# Patient Record
Sex: Male | Born: 1985 | Race: White | Hispanic: No | Marital: Married | Smoking: Never smoker
Health system: Southern US, Community
[De-identification: ages and names within clinical notes are randomized; demographics above are authoritative.]

## PROBLEM LIST (undated history)

## (undated) DIAGNOSIS — I10 Essential (primary) hypertension: Secondary | ICD-10-CM

## (undated) HISTORY — DX: Essential (primary) hypertension: I10

---

## 2016-04-20 DIAGNOSIS — Z800 Family history of malignant neoplasm of digestive organs: Secondary | ICD-10-CM | POA: Insufficient documentation

## 2016-10-16 ENCOUNTER — Ambulatory Visit: Payer: Self-pay | Admitting: Physician Assistant

## 2016-10-16 ENCOUNTER — Encounter: Payer: Self-pay | Admitting: Physician Assistant

## 2016-10-16 VITALS — BP 160/102 | HR 78 | Temp 97.9°F

## 2016-10-16 DIAGNOSIS — I10 Essential (primary) hypertension: Secondary | ICD-10-CM

## 2016-10-16 DIAGNOSIS — J00 Acute nasopharyngitis [common cold]: Secondary | ICD-10-CM

## 2016-10-16 MED ORDER — FLUTICASONE PROPIONATE 50 MCG/ACT NA SUSP: 2.0000 | Freq: Every day | NASAL | 6 refills | Status: DC

## 2016-10-16 MED ORDER — LOSARTAN POTASSIUM-HCTZ 100-25 MG PO TABS: 1.0000 | ORAL_TABLET | Freq: Every day | ORAL | 3 refills | Status: DC

## 2016-10-16 NOTE — Progress Notes (Signed)
S: here for bp med refill, has been out of medication for 3 months, thinks he was on 100mg  of losartan, not sure how many mg of hctz, states he forgets to take the medication and has not been compliant in past, has new pcp in [place], will see them in January, also has runny, nose and congestion, mucus is not colored, no fever/chills, cp/sob  O: vitals w elevated bp at 148/100  And 160/102; tms clear, nasal mucosa swollen, throat wnl, neck supple no lymph, lungs c t a, cv rrr  A: htn, allergic rhinitis  P: losartan/hctz 100/25; flonase, discussed htn and if not controlled what it will damage, suggest he see opth to check eyes, see pcp in January

## 2017-04-14 ENCOUNTER — Other Ambulatory Visit: Payer: Self-pay

## 2017-04-27 ENCOUNTER — Ambulatory Visit: Payer: Self-pay | Admitting: Physician Assistant

## 2017-04-27 ENCOUNTER — Encounter (INDEPENDENT_AMBULATORY_CARE_PROVIDER_SITE_OTHER): Payer: Self-pay

## 2017-04-27 ENCOUNTER — Encounter: Payer: Self-pay | Admitting: Physician Assistant

## 2017-04-27 VITALS — BP 141/90 | HR 90 | Temp 98.5°F | Resp 16 | Ht 71.0 in | Wt 260.0 lb

## 2017-04-27 DIAGNOSIS — Z0000 Encounter for general adult medical examination without abnormal findings: Secondary | ICD-10-CM

## 2017-04-27 DIAGNOSIS — I10 Essential (primary) hypertension: Secondary | ICD-10-CM | POA: Insufficient documentation

## 2017-04-27 MED ORDER — LOSARTAN POTASSIUM-HCTZ 100-25 MG PO TABS: 1.0000 | ORAL_TABLET | Freq: Every day | ORAL | 12 refills | Status: DC

## 2017-04-27 NOTE — Progress Notes (Signed)
S: pt here for wellness physical had biometrics for insurance purposes done at work, states he is often thirsty and feels dehydrated a lot, has a cough that has lasted several months but its mostly in the mornings, mucus is clear to white without blood, had a colonoscopy screening last year which was normal, will repeat in 5 years due to his mother's hx of colon ca at 34; also worried about sleep apnea, states his wife says he stops breathing and snores really bad, no other complaints ros neg. PMH: htn   Social:  Nonsmoker, no etoh or drugs Fam:as noted on chart  O: vitals wnl, nad, ENT wnl, neck supple no lymph, lungs c t a, cv rrr, abd soft nontender bs normal all 4 quads  A: wellness, biometric physical, ?sleep apnea  P: labs, weight loss, home sleep study, med refill on losartan-hctz 100-25

## 2017-04-28 LAB — CMP12+LP+TP+TSH+6AC+CBC/D/PLT
A/G RATIO: 2.1 (ref 1.2–2.2)
ALBUMIN: 4.8 g/dL (ref 3.5–5.5)
ALT: 141 IU/L — ABNORMAL HIGH (ref 0–44)
AST: 91 IU/L — AB (ref 0–40)
Alkaline Phosphatase: 86 IU/L (ref 39–117)
BASOS ABS: 0 10*3/uL (ref 0.0–0.2)
BILIRUBIN TOTAL: 0.5 mg/dL (ref 0.0–1.2)
BUN/Creatinine Ratio: 9 (ref 9–20)
BUN: 9 mg/dL (ref 6–20)
Basos: 0 %
CREATININE: 1 mg/dL (ref 0.76–1.27)
Calcium: 9.5 mg/dL (ref 8.7–10.2)
Chloride: 102 mmol/L (ref 96–106)
Chol/HDL Ratio: 5.3 ratio — ABNORMAL HIGH (ref 0.0–5.0)
Cholesterol, Total: 238 mg/dL — ABNORMAL HIGH (ref 100–199)
EOS (ABSOLUTE): 0.2 10*3/uL (ref 0.0–0.4)
ESTIMATED CHD RISK: 1.1 times avg. — AB (ref 0.0–1.0)
Eos: 2 %
FREE THYROXINE INDEX: 2.5 (ref 1.2–4.9)
GFR calc Af Amer: 118 mL/min/{1.73_m2} (ref >59)
GFR, EST NON AFRICAN AMERICAN: 102 mL/min/{1.73_m2} (ref >59)
GGT: 69 IU/L — AB (ref 0–65)
Globulin, Total: 2.3 g/dL (ref 1.5–4.5)
Glucose: 71 mg/dL (ref 65–99)
HDL: 45 mg/dL (ref >39)
HEMOGLOBIN: 15.4 g/dL (ref 13.0–17.7)
Hematocrit: 44.5 % (ref 37.5–51.0)
IMMATURE GRANULOCYTES: 0 %
IRON: 71 ug/dL (ref 38–169)
Immature Grans (Abs): 0 10*3/uL (ref 0.0–0.1)
LDH: 324 IU/L — ABNORMAL HIGH (ref 121–224)
LDL Calculated: 161 mg/dL — ABNORMAL HIGH (ref 0–99)
LYMPHS ABS: 3.1 10*3/uL (ref 0.7–3.1)
Lymphs: 37 %
MCH: 29.8 pg (ref 26.6–33.0)
MCHC: 34.6 g/dL (ref 31.5–35.7)
MCV: 86 fL (ref 79–97)
MONOS ABS: 0.6 10*3/uL (ref 0.1–0.9)
Monocytes: 8 %
NEUTROS PCT: 53 %
Neutrophils Absolute: 4.6 10*3/uL (ref 1.4–7.0)
PHOSPHORUS: 3.3 mg/dL (ref 2.5–4.5)
PLATELETS: 288 10*3/uL (ref 150–379)
Potassium: 3.8 mmol/L (ref 3.5–5.2)
RBC: 5.16 x10E6/uL (ref 4.14–5.80)
RDW: 14.4 % (ref 12.3–15.4)
Sodium: 142 mmol/L (ref 134–144)
T3 UPTAKE RATIO: 24 % (ref 24–39)
T4 TOTAL: 10.5 ug/dL (ref 4.5–12.0)
TOTAL PROTEIN: 7.1 g/dL (ref 6.0–8.5)
TSH: 1.5 u[IU]/mL (ref 0.450–4.500)
Triglycerides: 160 mg/dL — ABNORMAL HIGH (ref 0–149)
URIC ACID: 9.4 mg/dL — AB (ref 3.7–8.6)
VLDL CHOLESTEROL CAL: 32 mg/dL (ref 5–40)
WBC: 8.6 10*3/uL (ref 3.4–10.8)

## 2017-04-28 LAB — HCV COMMENT:

## 2017-04-28 LAB — HEMOGLOBIN A1C
ESTIMATED AVERAGE GLUCOSE: 97 mg/dL
HEMOGLOBIN A1C: 5 % (ref 4.8–5.6)

## 2017-04-28 LAB — VITAMIN D 25 HYDROXY (VIT D DEFICIENCY, FRACTURES): VIT D 25 HYDROXY: 14.3 ng/mL — AB (ref 30.0–100.0)

## 2017-04-28 LAB — HEPATITIS C ANTIBODY (REFLEX)

## 2017-11-03 ENCOUNTER — Encounter: Payer: Self-pay | Admitting: Physician Assistant

## 2017-11-03 ENCOUNTER — Ambulatory Visit: Payer: Self-pay | Admitting: Physician Assistant

## 2017-11-03 VITALS — BP 120/80 | HR 96 | Temp 98.5°F | Resp 16

## 2017-11-03 DIAGNOSIS — I10 Essential (primary) hypertension: Secondary | ICD-10-CM

## 2017-11-03 MED ORDER — TELMISARTAN-HCTZ 40-12.5 MG PO TABS: 1.0000 | ORAL_TABLET | Freq: Every day | ORAL | 0 refills | Status: DC

## 2017-11-03 NOTE — Addendum Note (Signed)
Addended by: Joni ReiningSMITH, RONALD K on: 11/03/2017 08:50 AM   Modules accepted: Orders

## 2017-11-03 NOTE — Progress Notes (Signed)
   Subjective: Medication request     Patient ID: Sean Wolf, male    DOB: 1986-10-17, 31 y.o.   MRN: [id number]  HPI Patient's request medication change from Hyzaar for hypertension secondary to change of insurance plan. Patient state he has enough Hyzaar to last for 1 month. Review of Systems Hypertension    Objective:   Physical Exam  No acute distress. Vital signs stable. HEENT is unremarkable. Neck is supple full and stand. Lungs clear to auscultation heart regular rate and rhythm.       Assessment & Plan: Hypertension   Patient will get baseline labs. Change from Hyzaar to Micardis/HCT 40/12.5. Will follow bimonthly and titrated as necessary.

## 2017-11-10 ENCOUNTER — Other Ambulatory Visit: Payer: Self-pay | Admitting: Physician Assistant

## 2017-11-10 VITALS — BP 125/80 | HR 82 | Temp 98.5°F | Resp 16 | Ht 71.0 in | Wt 276.0 lb

## 2017-11-10 DIAGNOSIS — Z299 Encounter for prophylactic measures, unspecified: Secondary | ICD-10-CM

## 2017-11-10 MED ORDER — LOSARTAN POTASSIUM-HCTZ 50-12.5 MG PO TABS: 1.0000 | ORAL_TABLET | Freq: Two times a day (BID) | ORAL | 3 refills | Status: DC

## 2017-11-10 NOTE — Progress Notes (Signed)
   Subjective: Biometric exam     Patient ID: Sean Wolf, male    DOB: May 03, 1986, 31 y.o.   MRN: [id number]  HPI Patient presents for biometric exam and requests refill of medication for high blood pressure.   Review of Systems Hypertension    Objective:   Physical Exam HEENT is unremarkable. Neck is supple without adenopathy. Lungs are clear to auscultation and heart is regular rate and rhythm. Normal active bowel sounds was negative HSM. Abdomen soft and nontender palpation. Examination of the cervical lumbar spine reveals no deformity. Patient has full  range of motion of the cervicolumbar spine. Examination upper and lower extremity shows no deformity. Patient has full range of motion upper and lower extremities. Strength against resistance 5 over 5. Patient cranial nerves II through XII grossly intact.       Assessment & Plan: Physical exam and medication refill.   Patient medication for low salt/ACT see was refill.

## 2017-11-10 NOTE — Addendum Note (Signed)
Addended by: Catha BrowEACON, MONIQUE T on: 11/10/2017 10:32 AM   Modules accepted: Orders

## 2017-11-11 LAB — CMP12+LP+TP+TSH+6AC+CBC/D/PLT
A/G RATIO: 2.1 (ref 1.2–2.2)
ALK PHOS: 89 IU/L (ref 39–117)
ALT: 87 IU/L — ABNORMAL HIGH (ref 0–44)
AST: 52 IU/L — AB (ref 0–40)
Albumin: 4.5 g/dL (ref 3.5–5.5)
BASOS: 1 %
BILIRUBIN TOTAL: 0.3 mg/dL (ref 0.0–1.2)
BUN / CREAT RATIO: 10 (ref 9–20)
BUN: 10 mg/dL (ref 6–20)
Basophils Absolute: 0 10*3/uL (ref 0.0–0.2)
CHLORIDE: 106 mmol/L (ref 96–106)
CREATININE: 0.96 mg/dL (ref 0.76–1.27)
Calcium: 9.2 mg/dL (ref 8.7–10.2)
Chol/HDL Ratio: 4.9 ratio (ref 0.0–5.0)
Cholesterol, Total: 211 mg/dL — ABNORMAL HIGH (ref 100–199)
EOS (ABSOLUTE): 0.2 10*3/uL (ref 0.0–0.4)
EOS: 4 %
ESTIMATED CHD RISK: 1 times avg. (ref 0.0–1.0)
FREE THYROXINE INDEX: 2 (ref 1.2–4.9)
GFR calc non Af Amer: 106 mL/min/{1.73_m2} (ref >59)
GFR, EST AFRICAN AMERICAN: 123 mL/min/{1.73_m2} (ref >59)
GGT: 49 IU/L (ref 0–65)
GLUCOSE: 88 mg/dL (ref 65–99)
Globulin, Total: 2.1 g/dL (ref 1.5–4.5)
HDL: 43 mg/dL (ref >39)
HEMATOCRIT: 40.6 % (ref 37.5–51.0)
HEMOGLOBIN: 14.2 g/dL (ref 13.0–17.7)
Immature Grans (Abs): 0 10*3/uL (ref 0.0–0.1)
Immature Granulocytes: 1 %
Iron: 80 ug/dL (ref 38–169)
LDH: 263 IU/L — ABNORMAL HIGH (ref 121–224)
LDL CALC: 147 mg/dL — AB (ref 0–99)
LYMPHS ABS: 2.5 10*3/uL (ref 0.7–3.1)
Lymphs: 39 %
MCH: 29.7 pg (ref 26.6–33.0)
MCHC: 35 g/dL (ref 31.5–35.7)
MCV: 85 fL (ref 79–97)
MONOCYTES: 10 %
Monocytes Absolute: 0.6 10*3/uL (ref 0.1–0.9)
NEUTROS PCT: 45 %
Neutrophils Absolute: 3 10*3/uL (ref 1.4–7.0)
PLATELETS: 270 10*3/uL (ref 150–379)
POTASSIUM: 4.2 mmol/L (ref 3.5–5.2)
Phosphorus: 3.1 mg/dL (ref 2.5–4.5)
RBC: 4.78 x10E6/uL (ref 4.14–5.80)
RDW: 14 % (ref 12.3–15.4)
Sodium: 144 mmol/L (ref 134–144)
T3 Uptake Ratio: 23 % — ABNORMAL LOW (ref 24–39)
T4, Total: 8.8 ug/dL (ref 4.5–12.0)
TSH: 1.1 u[IU]/mL (ref 0.450–4.500)
Total Protein: 6.6 g/dL (ref 6.0–8.5)
Triglycerides: 107 mg/dL (ref 0–149)
URIC ACID: 8.3 mg/dL (ref 3.7–8.6)
VLDL CHOLESTEROL CAL: 21 mg/dL (ref 5–40)
WBC: 6.4 10*3/uL (ref 3.4–10.8)

## 2017-11-11 LAB — HCV COMMENT:

## 2017-11-11 LAB — HEPATITIS C ANTIBODY (REFLEX)

## 2017-11-11 LAB — HIV ANTIBODY (ROUTINE TESTING W REFLEX): HIV Screen 4th Generation wRfx: NONREACTIVE

## 2017-11-11 LAB — VITAMIN D 25 HYDROXY (VIT D DEFICIENCY, FRACTURES): Vit D, 25-Hydroxy: 14.6 ng/mL — ABNORMAL LOW (ref 30.0–100.0)

## 2018-07-04 ENCOUNTER — Encounter: Payer: Self-pay | Admitting: Neurology

## 2018-07-04 ENCOUNTER — Ambulatory Visit (INDEPENDENT_AMBULATORY_CARE_PROVIDER_SITE_OTHER): Payer: BC Managed Care – PPO | Admitting: Neurology

## 2018-07-04 VITALS — BP 152/103 | HR 86 | Ht 71.0 in | Wt 271.5 lb

## 2018-07-04 DIAGNOSIS — R400 Somnolence: Secondary | ICD-10-CM | POA: Diagnosis not present

## 2018-07-04 DIAGNOSIS — R030 Elevated blood-pressure reading, without diagnosis of hypertension: Secondary | ICD-10-CM | POA: Diagnosis not present

## 2018-07-04 DIAGNOSIS — R0683 Snoring: Secondary | ICD-10-CM | POA: Diagnosis not present

## 2018-07-04 DIAGNOSIS — R51 Headache: Secondary | ICD-10-CM

## 2018-07-04 DIAGNOSIS — R0681 Apnea, not elsewhere classified: Secondary | ICD-10-CM

## 2018-07-04 DIAGNOSIS — Z820 Family history of epilepsy and other diseases of the nervous system: Secondary | ICD-10-CM

## 2018-07-04 DIAGNOSIS — R519 Headache, unspecified: Secondary | ICD-10-CM

## 2018-07-04 DIAGNOSIS — Z789 Other specified health status: Secondary | ICD-10-CM

## 2018-07-04 DIAGNOSIS — E669 Obesity, unspecified: Secondary | ICD-10-CM

## 2018-07-04 NOTE — Progress Notes (Signed)
Subjective:    Patient ID: Sean FreezeWilliam B Wolf is a 32 y.o. male.  HPI     Sean FoleySaima Daron Stutz, MD, PhD [facility] [address], [address] [address] [place], [place] [address]  Dear Dr. Mcneil SoberGivans,   I saw your patient, Sean Wolf, upon your kind request, in my neurologic clinic today for initial consultation of his sleep disorder, in particular, concern for underlying obstructive sleep apnea. The patient is accompanied by his daughter today. As you know, Sean Wolf is a 32 year old right-handed gentleman with an underlying medical history of hypertension and obesity, who reports snoring and excessive daytime somnolence. I reviewed your office records, which you kindly included. His wife has reported that he has pauses in his breathing and snores loudly. He has also woken up with a sense of gasping for air. He has frequent morning headaches and uses BC powder for his headaches. He denies night to night nocturia except for when he takes his blood pressure medicine late at night. He is supposed to take 2 pills together daily but sometimes does not take it at all. He has gained weight. Bedtime is around 8 PM and rise time around 6 AM. [place] for his [place] related commute and gets sleepy at the wheel, admits to having dosed off at the wheel.  His Epworth sleepiness score is 19 out of 24 today, fatigue score is 57 out of 63. He is married and lives with his wife and 2 children. He works for [employer]. He is a nonsmoker and does not utilize alcohol on a regular basis, does drink caffeine in the form of regular soda, about 4-5 cans per day on average. His mother was diagnosed with obstructive sleep apnea and had a CPAP machine, then had weight loss surgery. He reports that his father had alcoholism.  He wakes up typically with his mouth severely dry, he suspects that he is a mouth breather. He also complains of postnasal drip, typically year long.  His Past Medical  History Is Significant For: Past Medical History:  Diagnosis Date  . Hypertension     His Past Surgical History Is Significant For: No past surgical history on file.  His Family History Is Significant For: Family History  Problem Relation Age of Onset  . Cancer Mother [id number]       colon  . Alcohol abuse Father   . Diabetes Father   . Drug abuse Brother   . Stroke Brother        from vegetation due to iv drug use  . Cancer Maternal Grandmother        lymphoma  . Cancer Paternal Grandmother        breast  . Stroke Paternal Grandmother   . Early death Neg Hx   . Hyperlipidemia Neg Hx   . Heart disease Neg Hx     His Social History Is Significant For: Social History   Socioeconomic History  . Marital status: Married    Spouse name: Not on file  . Number of children: Not on file  . Years of education: Not on file  . Highest education level: Not on file  Occupational History    Comment: [employer]  Social Needs  . Financial resource strain: Not on file  . Food insecurity:    Worry: Not on file    Inability: Not on file  . Transportation needs:    Medical: Not on file  Non-medical: Not on file  Tobacco Use  . Smoking status: Never Smoker  . Smokeless tobacco: Never Used  Substance and Sexual Activity  . Alcohol use: No  . Drug use: No  . Sexual activity: Not on file  Lifestyle  . Physical activity:    Days per week: Not on file    Minutes per session: Not on file  . Stress: Not on file  Relationships  . Social connections:    Talks on phone: Not on file    Gets together: Not on file    Attends religious service: Not on file    Active member of club or organization: Not on file    Attends meetings of clubs or organizations: Not on file    Relationship status: Not on file  Other Topics Concern  . Not on file  Social History Narrative  . Not on file    His Allergies Are:  No Known Allergies:   His Current Medications Are:  Outpatient Encounter  Medications as of 07/04/2018  Medication Sig  . losartan-hydrochlorothiazide (HYZAAR) 50-12.5 MG tablet Take 1 tablet by mouth 2 (two) times daily.   No facility-administered encounter medications on file as of 07/04/2018.   :  Review of Systems:  Out of a complete 14 point review of systems, all are reviewed and negative with the exception of these symptoms as listed below: Review of Systems  Neurological:       Patient endorses insomnia, sleepiness, ans snoring.   Epworth Sleepiness Scale 0= would never doze 1= slight chance of dozing 2= moderate chance of dozing 3= high chance of dozing  Sitting and reading:3 Watching TV:3 Sitting inactive in a public place (ex. Theater or meeting):1 As a passenger in a car for an hour without a break:3 Lying down to rest in the afternoon:3 Sitting and talking to someone:1 Sitting quietly after lunch (no alcohol):2 In a car, while stopped in traffic:3 Total:19    Objective:  Neurological Exam  Physical Exam Physical Examination:   Vitals:   07/04/18 1503  BP: (!) 152/103  Pulse: 86    General Examination: The patient is a very pleasant 32 y.o. male in no acute distress. He appears well-developed and well-nourished and well groomed.   HEENT: Normocephalic, atraumatic, pupils are equal, round and reactive to light and accommodation. Extraocular tracking is good without limitation to gaze excursion or nystagmus noted. Normal smooth pursuit is noted. Hearing is grossly intact. Face is symmetric with normal facial animation and normal facial sensation. Speech is clear with no dysarthria noted. There is no hypophonia. There is no lip, neck/head, jaw or voice tremor. Neck is supple with full range of passive and active motion. There are no carotid bruits on auscultation. Oropharynx exam reveals: mild mouth dryness, adequate dental hygiene and moderate airway crowding, due to tonsils of 2+, and larger uvula. Mallampati is class II. Tongue protrudes  centrally and palate elevates symmetrically. Neck size is 18.25 in. He has an absent overbite. Nasal inspection reveals no significant nasal mucosal bogginess or redness and slight septal deviation to the left.   Chest: Clear to auscultation without wheezing, rhonchi or crackles noted.  Heart: S1+S2+0, regular and normal without murmurs, rubs or gallops noted.   Abdomen: Soft, non-tender and non-distended with normal bowel sounds appreciated on auscultation.  Extremities: There is no pitting edema in the distal lower extremities bilaterally. Pedal pulses are intact.  Skin: Warm and dry without trophic changes noted.  Musculoskeletal: exam reveals no obvious  joint deformities, tenderness or joint swelling or erythema.   Neurologically:  Mental status: The patient is awake, alert and oriented in all 4 spheres. His immediate and remote memory, attention, language skills and fund of knowledge are appropriate. There is no evidence of aphasia, agnosia, apraxia or anomia. Speech is clear with normal prosody and enunciation. Thought process is linear. Mood is normal and affect is normal.  Cranial nerves II - XII are as described above under HEENT exam. In addition: shoulder shrug is normal with equal shoulder height noted. Motor exam: Normal bulk, strength and tone is noted. There is no drift, tremor or rebound. Romberg is negative. Reflexes are 2+ throughout. Fine motor skills and coordination: intact with normal finger taps, normal hand movements, normal rapid alternating patting, normal foot taps and normal foot agility.  Cerebellar testing: No dysmetria or intention tremor on finger to nose testing. Heel to shin is unremarkable bilaterally. There is no truncal or gait ataxia.  Sensory exam: intact to light touch in the upper and lower extremities.  Gait, station and balance: He stands easily. No veering to one side is noted. No leaning to one side is noted. Posture is age-appropriate and stance is  narrow based. Gait shows normal stride length and normal pace. No problems turning are noted. Tandem walk is unremarkable.   Assessment and Plan:  In summary, Sean Wolf is a very pleasant 32 y.o.-year old male with an underlying medical history of hypertension and obesity, whose history and physical exam are concerning for obstructive sleep apnea (OSA). I had a long chat with the patient about my findings and the diagnosis of OSA, its prognosis and treatment options. We talked about medical treatments, surgical interventions and non-pharmacological approaches. I explained in particular the risks and ramifications of untreated moderate to severe OSA, especially with respect to developing cardiovascular disease down the Road, including congestive heart failure, difficult to treat hypertension, cardiac arrhythmias, or stroke. Even type 2 diabetes has, in part, been linked to untreated OSA. Symptoms of untreated OSA include daytime sleepiness, memory problems, mood irritability and mood disorder such as depression and anxiety, lack of energy, as well as recurrent headaches, especially morning headaches. We talked about trying to maintain a healthy lifestyle in general, as well as the importance of weight control. I encouraged the patient to eat healthy, exercise daily and keep well hydrated, to keep a scheduled bedtime and wake time routine, to not skip any meals and eat healthy snacks in between meals. I advised the patient strongly not to drive when feeling sleepy. I recommended the following at this time: sleep study with potential positive airway pressure titration. (We will score hypopneas at 3%).   I explained the sleep test procedure to the patient and also outlined possible surgical and non-surgical treatment options of OSA, including the use of a custom-made dental device (which would require a referral to a specialist dentist or oral surgeon), upper airway surgical options, such as pillar implants,  radiofrequency surgery, tongue base surgery, and UPPP (which would involve a referral to an ENT surgeon). Rarely, jaw surgery such as mandibular advancement may be considered.  I also explained the CPAP treatment option to the patient, who indicated that he would be willing to try CPAP if the need arises. I explained the importance of being compliant with PAP treatment, not only for insurance purposes but primarily to improve His symptoms, and for the patient's long term health benefit, including to reduce His cardiovascular risks. I answered all  his questions today and the patient was in agreement. I would like to see him back after the sleep study is completed and encouraged him to call with any interim questions, concerns, problems or updates.   Thank you very much for allowing me to participate in the care of this nice patient. If I can be of any further assistance to you please do not hesitate to call me at [phone].  Sincerely,   Sean Foley, MD, PhD

## 2018-07-04 NOTE — Patient Instructions (Signed)

## 2018-07-19 ENCOUNTER — Ambulatory Visit (INDEPENDENT_AMBULATORY_CARE_PROVIDER_SITE_OTHER): Payer: BC Managed Care – PPO | Admitting: Neurology

## 2018-07-19 DIAGNOSIS — R400 Somnolence: Secondary | ICD-10-CM

## 2018-07-19 DIAGNOSIS — R0681 Apnea, not elsewhere classified: Secondary | ICD-10-CM | POA: Diagnosis not present

## 2018-07-19 DIAGNOSIS — Z789 Other specified health status: Secondary | ICD-10-CM

## 2018-07-19 DIAGNOSIS — R0683 Snoring: Secondary | ICD-10-CM

## 2018-07-19 DIAGNOSIS — R030 Elevated blood-pressure reading, without diagnosis of hypertension: Secondary | ICD-10-CM

## 2018-07-19 DIAGNOSIS — G4720 Circadian rhythm sleep disorder, unspecified type: Secondary | ICD-10-CM

## 2018-07-19 DIAGNOSIS — R51 Headache: Secondary | ICD-10-CM

## 2018-07-19 DIAGNOSIS — G4733 Obstructive sleep apnea (adult) (pediatric): Secondary | ICD-10-CM

## 2018-07-19 DIAGNOSIS — R519 Headache, unspecified: Secondary | ICD-10-CM

## 2018-07-19 DIAGNOSIS — Z820 Family history of epilepsy and other diseases of the nervous system: Secondary | ICD-10-CM

## 2018-07-19 DIAGNOSIS — E669 Obesity, unspecified: Secondary | ICD-10-CM

## 2018-07-21 ENCOUNTER — Telehealth: Payer: Self-pay

## 2018-07-21 NOTE — Telephone Encounter (Signed)
I called pt to discuss his sleep study results. No answer, left a message asking him to call me back. 

## 2018-07-21 NOTE — Telephone Encounter (Signed)
I called pt. I advised pt that Dr. Frances FurbishAthar reviewed their sleep study results and found that pt has a moderate to severe osa with an O2 nadir of 78% and recommends that pt be treated with a cpap. Dr. Frances FurbishAthar recommends that pt return for a repeat sleep study in order to properly titrate the cpap and ensure a good mask fit. Pt is agreeable to returning for a titration study. I advised pt that our sleep lab will file with pt's insurance and call pt to schedule the sleep study when we hear back from the pt's insurance regarding coverage of this sleep study. Pt verbalized understanding of results. Pt had no questions at this time but was encouraged to call back if questions arise.

## 2018-07-21 NOTE — Procedures (Signed)
PATIENT'S NAME:  Sean Wolf, Sean Wolf DOB:      1986/10/31      MR#:    [id number]     DATE OF RECORDING: 07/19/2018 REFERRING M.D.:  Gaspar Cola DMD Study Performed:   Baseline Polysomnogram HISTORY: 32 year old man with a medical history of hypertension and obesity, who reports snoring and excessive daytime somnolence. The patient endorsed the Epworth Sleepiness Scale at 19 points. The patient's weight 271 pounds with a height of 71 (inches), resulting in a BMI of 38. kg/m2. The patient's neck circumference measured 18.25 inches.  CURRENT MEDICATIONS: Hyzaar   PROCEDURE:  This is a multichannel digital polysomnogram utilizing the Somnostar 11.2 system.  Electrodes and sensors were applied and monitored per AASM Specifications.   EEG, EOG, Chin and Limb EMG, were sampled at 200 Hz.  ECG, Snore and Nasal Pressure, Thermal Airflow, Respiratory Effort, CPAP Flow and Pressure, Oximetry was sampled at 50 Hz. Digital video and audio were recorded.      BASELINE STUDY  Lights Out was at 21:08 and Lights On at 04:51.  Total recording time (TRT) was 464 minutes, with a total sleep time (TST) of 397.5 minutes. The patient's sleep latency was 44 minutes, which is delayed. REM latency was 68.5 minutes. The sleep efficiency was 85.7%.     SLEEP ARCHITECTURE: WASO (Wake after sleep onset) was 14 minutes. There were 16.5 minutes in Stage N1, 84.5 minutes Stage N2, 169 minutes Stage N3 and 127.5 minutes in Stage REM. The percentage of Stage N1 was 4.2%, Stage N2 was 21.3%, Stage N3 was 42.5%, which is increased, and Stage R (REM sleep) was 32.1%, which is increased. The arousals were noted as: 23 were spontaneous, 0 were associated with PLMs, 15 were associated with respiratory events.  RESPIRATORY ANALYSIS:  There were a total of 131 respiratory events:  14 obstructive apneas, 0 central apneas and 0 mixed apneas with a total of 14 apneas and an apnea index (AI) of 2.1 /hour. There were 117 hypopneas with a hypopnea  index of 17.7 /hour. The patient also had 0 respiratory event related arousals (RERAs).      The total APNEA/HYPOPNEA INDEX (AHI) was 19.8/hour and the total RESPIRATORY DISTURBANCE INDEX was 0. 19.8 /hour.  114 events occurred in REM sleep and 28 events in NREM. The REM AHI was 53.6 /hour, versus a non-REM AHI of 3.8. The patient spent 304 minutes of total sleep time in the supine position and 94 minutes in non-supine.. The supine AHI was 21.7/hour versus a non-supine AHI of 13.4.  OXYGEN SATURATION & C02:  The Wake baseline 02 saturation was 98%, with the lowest being 78%. Time spent below 89% saturation equaled 25 minutes.   PERIODIC LIMB MOVEMENTS: The patient had a total of 0 Periodic Limb Movements.  The Periodic Limb Movement (PLM) index was 0 and the PLM Arousal index was 0/hour. Audio and video analysis did not show any abnormal or unusual movements, behaviors, phonations or vocalizations. The patient took no bathroom breaks. Moderate to loud snoring was noted. The EKG was in keeping with normal sinus rhythm (NSR).  Post-study, the patient indicated that sleep was the same as usual.   IMPRESSION:  1. Obstructive Sleep Apnea (OSA) 2. Dysfunctions associated with sleep stages or arousal from sleep  RECOMMENDATIONS:  1. This study demonstrates moderate to severe obstructive sleep apnea, with a total AHI of 19.8/hour, REM AHI of 53.6/hour, supine AHI of 21.7/hour and O2 nadir of 78%. Treatment with positive airway pressure in the  form of CPAP is recommended. This will require a full night titration study to optimize therapy. Other treatment options (generally speaking) may include avoidance of supine sleep position along with weight loss, upper airway or jaw surgery in selected patients or the use of an oral appliance in certain patients. ENT evaluation and/or consultation with a maxillofacial surgeon or dentist may be feasible in some instances. Please note that untreated obstructive sleep  apnea may carry additional perioperative morbidity. Patients with significant obstructive sleep apnea should receive perioperative PAP therapy and the surgeons and particularly the anesthesiologist should be informed of the diagnosis and the severity of the sleep disordered breathing. 2. This study shows sleep fragmentation and abnormal sleep stage percentages; these are nonspecific findings and per se do not signify an intrinsic sleep disorder or a cause for the patient's sleep-related symptoms. Causes include (but are not limited to) the first night effect of the sleep study, circadian rhythm disturbances, medication effect or an underlying mood disorder or medical problem.  3. The patient should be cautioned not to drive, work at heights, or operate dangerous or heavy equipment when tired or sleepy. Review and reiteration of good sleep hygiene measures should be pursued with any patient. 4. The patient will be seen in follow-up [facility] for discussion of the test results, symptom and treatment compliance review, further management strategies, etc. The referring provider will be notified of the test results.  I certify that I have reviewed the entire raw data recording prior to the issuance of this report in accordance with the Standards of Accreditation of the American Academy of Sleep Medicine (AASM)   Huston FoleySaima Yvan Dority, MD, PhD Diplomat, American Board of Neurology and Sleep Medicine (Neurology and Sleep Medicine)

## 2018-07-21 NOTE — Addendum Note (Signed)
Addended by: Huston FoleyATHAR, Raliyah Montella on: 07/21/2018 08:18 AM   Modules accepted: Orders

## 2018-07-21 NOTE — Telephone Encounter (Signed)
-----   Message from Huston FoleySaima Athar, MD sent at 07/21/2018  8:18 AM EDT ----- Patient referred by dentist, seen by me on 07/04/18, diagnostic PSG on 07/19/18.   Please call and notify the patient that the recent sleep study showed moderate to severe obstructive sleep apnea, with a total AHI of 19.8/hour, REM AHI of 53.6/hour, supine AHI of 21.7/hour and O2 nadir of 78%. Treatment with positive airway pressure in the form of CPAP is recommended. This will require a repeat sleep study for proper titration and mask fitting and correct monitoring of the oxygen saturations. Please explain to patient. I have placed an order in the chart. Thanks.  Huston FoleySaima Athar, MD, PhD [facility])

## 2018-07-21 NOTE — Progress Notes (Signed)
Patient referred by dentist, seen by me on 07/04/18, diagnostic PSG on 07/19/18.   Please call and notify the patient that the recent sleep study showed moderate to severe obstructive sleep apnea, with a total AHI of 19.8/hour, REM AHI of 53.6/hour, supine AHI of 21.7/hour and O2 nadir of 78%. Treatment with positive airway pressure in the form of CPAP is recommended. This will require a repeat sleep study for proper titration and mask fitting and correct monitoring of the oxygen saturations. Please explain to patient. I have placed an order in the chart. Thanks.  Huston FoleySaima Fleda Pagel, MD, PhD [facility])

## 2018-08-12 ENCOUNTER — Ambulatory Visit (INDEPENDENT_AMBULATORY_CARE_PROVIDER_SITE_OTHER): Payer: BC Managed Care – PPO | Admitting: Neurology

## 2018-08-12 DIAGNOSIS — G4733 Obstructive sleep apnea (adult) (pediatric): Secondary | ICD-10-CM

## 2018-08-12 DIAGNOSIS — G4720 Circadian rhythm sleep disorder, unspecified type: Secondary | ICD-10-CM

## 2018-08-12 DIAGNOSIS — R400 Somnolence: Secondary | ICD-10-CM

## 2018-08-12 DIAGNOSIS — Z820 Family history of epilepsy and other diseases of the nervous system: Secondary | ICD-10-CM

## 2018-08-12 DIAGNOSIS — R030 Elevated blood-pressure reading, without diagnosis of hypertension: Secondary | ICD-10-CM

## 2018-08-12 DIAGNOSIS — R519 Headache, unspecified: Secondary | ICD-10-CM

## 2018-08-12 DIAGNOSIS — R51 Headache: Secondary | ICD-10-CM

## 2018-08-12 DIAGNOSIS — E669 Obesity, unspecified: Secondary | ICD-10-CM

## 2018-08-15 ENCOUNTER — Telehealth: Payer: Self-pay

## 2018-08-15 NOTE — Telephone Encounter (Signed)
-----   Message from Huston FoleySaima Athar, MD sent at 08/15/2018  8:06 AM EDT ----- Patient referred by dentist, seen by me on 07/04/18, diagnostic PSG on 07/19/18. Patient had a CPAP titration study on 08/12/18.  Please call and inform patient that I have entered an order for treatment with positive airway pressure (PAP) treatment for obstructive sleep apnea (OSA). He did well during the latest sleep study with CPAP. We will, therefore, arrange for a machine for home use through a DME (durable medical equipment) company of His choice; and I will see the patient back in follow-up in about 10 weeks. Please also explain to the patient that I will be looking out for compliance data, which can be downloaded from the machine (stored on an SD card, that is inserted in the machine) or via remote access through a modem, that is built into the machine. At the time of the followup appointment we will discuss sleep study results and how it is going with PAP treatment at home. Please advise patient to bring His machine at the time of the first FU visit, even though this is cumbersome. Bringing the machine for every visit after that will likely not be needed, but often helps for the first visit to troubleshoot if needed. Please re-enforce the importance of compliance with treatment and the need for us to monitor compliance data - often an insurance requirement and actually good feedback for the patient as far as how they are doing.  Also remind patient, that any interim PAP machine or mask issues should be first addressed with the DME company, as they can often help better with technical and mask fit issues. Please ask if patient has a preference regarding DME company.  Please also make sure, the patient has a follow-up appointment with me in about 10 weeks from the setup date, thanks. May see one of our nurse practitioners if needed for proper timing of the FU appointment.  Please fax or rout report to the referring provider. Thanks,    Huston FoleySaima Athar, MD, PhD [facility])

## 2018-08-15 NOTE — Progress Notes (Signed)
Patient referred by dentist, seen by me on 07/04/18, diagnostic PSG on 07/19/18. Patient had a CPAP titration study on 08/12/18.  Please call and inform patient that I have entered an order for treatment with positive airway pressure (PAP) treatment for obstructive sleep apnea (OSA). He did well during the latest sleep study with CPAP. We will, therefore, arrange for a machine for home use through a DME (durable medical equipment) company of His choice; and I will see the patient back in follow-up in about 10 weeks. Please also explain to the patient that I will be looking out for compliance data, which can be downloaded from the machine (stored on an SD card, that is inserted in the machine) or via remote access through a modem, that is built into the machine. At the time of the followup appointment we will discuss sleep study results and how it is going with PAP treatment at home. Please advise patient to bring His machine at the time of the first FU visit, even though this is cumbersome. Bringing the machine for every visit after that will likely not be needed, but often helps for the first visit to troubleshoot if needed. Please re-enforce the importance of compliance with treatment and the need for us to monitor compliance data - often an insurance requirement and actually good feedback for the patient as far as how they are doing.  Also remind patient, that any interim PAP machine or mask issues should be first addressed with the DME company, as they can often help better with technical and mask fit issues. Please ask if patient has a preference regarding DME company.  Please also make sure, the patient has a follow-up appointment with me in about 10 weeks from the setup date, thanks. May see one of our nurse practitioners if needed for proper timing of the FU appointment.  Please fax or rout report to the referring provider. Thanks,   Huston FoleySaima Akira Perusse, MD, PhD [facility])

## 2018-08-15 NOTE — Telephone Encounter (Signed)
I called pt. I advised pt that Dr. Frances FurbishAthar reviewed their sleep study results and found that pt did well with the cpap during his latest sleep study. Dr. Frances FurbishAthar recommends that pt start a cpap at home. I reviewed PAP compliance expectations with the pt. Pt is agreeable to starting a CPAP. I advised pt that an order will be sent to a DME, [facility], and [facility] will call the pt within about one week after they file with the pt's insurance. [facility] will show the pt how to use the machine, fit for masks, and troubleshoot the CPAP if needed. A follow up appt was made for insurance purposes with Dr. Frances FurbishAthar on 11/09/2018 at 9:30am. Pt verbalized understanding to arrive 15 minutes early and bring their CPAP. A letter with all of this information in it will be mailed to the pt as a reminder and sent to his mychart account, per his request. I verified with the pt that the address we have on file is correct. Pt verbalized understanding of results. Pt had no questions at this time but was encouraged to call back if questions arise.

## 2018-08-15 NOTE — Procedures (Signed)
PATIENT'S NAME:  Sean Wolf, Sean Wolf DOB:      [birth year]      MR#:    [id number]     DATE OF RECORDING: 08/12/2018 REFERRING M.D.:  Gaspar Cola DMD Study Performed:   CPAP  Titration HISTORY: 32 year old man with a medical history of hypertension and obesity, who returns for a PAP titration study to treat his OSA. His baseline PSG on 07/19/18 showed an AHI of 19.8, REM AHI of 53.6, Supine AHI of 21.7, and a nadir of 78%. The patient endorsed the Epworth Sleepiness Scale at 19/24 points, BMI of 38. kg/m2. The patient's neck circumference measured 18 inches.  CURRENT MEDICATIONS: Hyzaar  PROCEDURE:  This is a multichannel digital polysomnogram utilizing the SomnoStar 11.2 system.  Electrodes and sensors were applied and monitored per AASM Specifications.   EEG, EOG, Chin and Limb EMG, were sampled at 200 Hz.  ECG, Snore and Nasal Pressure, Thermal Airflow, Respiratory Effort, CPAP Flow and Pressure, Oximetry was sampled at 50 Hz. Digital video and audio were recorded.      The patient was fitted with a small F20 Airtouch FFM (due to mouth venting). CPAP was initiated at 5 cmH20 with heated humidity per AASM standards and pressure was advanced to 9 cmH20 because of hypopneas, apneas and desaturations. At a PAP pressure of 9 cmH20, there was a reduction of the AHI to 0/hour with supine REM sleep achieved and O2 nadir of 94%.   Lights Out was at 21:01 and Lights On at 05:11. Total recording time (TRT) was 490 minutes, with a total sleep time (TST) of 415 minutes. The patient's sleep latency was 61.5 minutes, which is delayed. REM latency was 65.5 minutes, which is borderline low. The sleep efficiency was 84.7 %.    SLEEP ARCHITECTURE: WASO (Wake after sleep onset) was 11.5 minutes with no significant sleep fragmentation noted. There were 5 minutes in Stage N1, 145.5 minutes Stage N2, 112 minutes Stage N3 and 152.5 minutes in Stage REM. The percentage of Stage N1 was 1.2%, Stage N2 was 35.1%, Stage N3 was 27.%,  which is increased, and Stage R (REM sleep) was 36.7%, which is increased and in keeping with rebound. The arousals were noted as: 9 were spontaneous, 0 were associated with PLMs, 4 were associated with respiratory events.  RESPIRATORY ANALYSIS:  There was a total of 11 respiratory events: 0 obstructive apneas, 2 central apneas and 0 mixed apneas with a total of 2 apneas and an apnea index (AI) of .3 /hour. There were 9 hypopneas with a hypopnea index of 1.3/hour. The patient also had 0 respiratory event related arousals (RERAs).      The total APNEA/HYPOPNEA INDEX  (AHI) was 1.6 /hour and the total RESPIRATORY DISTURBANCE INDEX was 1.6 /hour  5 events occurred in REM sleep and 6 events in NREM. The REM AHI was 2. /hour versus a non-REM AHI of 1.4 /hour.  The patient spent 309.5 minutes of total sleep time in the supine position and 106 minutes in non-supine. The supine AHI was 1.8, versus a non-supine AHI of 1.1.  OXYGEN SATURATION & C02:  The baseline 02 saturation was 95%, with the lowest being 91%. Time spent below 89% saturation equaled 0 minutes.  PERIODIC LIMB MOVEMENTS:  The patient had a total of 0 Periodic Limb Movements. The Periodic Limb Movement (PLM) index was 0 and the PLM Arousal index was 0 /hour.  Audio and video analysis did not show any abnormal or unusual movements, behaviors, phonations or vocalizations.  The patient took no bathroom breaks. The EKG was in keeping with normal sinus rhythm (NSR).  Post-study, the patient indicated that sleep was better than usual.   IMPRESSION:   1. Obstructive Sleep Apnea (OSA)   RECOMMENDATIONS:   1. This study demonstrates resolution of the patient's obstructive sleep apnea with CPAP therapy. I will, therefore, start the patient on home CPAP treatment at a pressure of 9 cm via small FFM, with heated humidity. The patient should be reminded to be fully compliant with PAP therapy to improve sleep related symptoms and decrease long term  cardiovascular risks. The patient should be reminded, that it may take up to 3 months to get fully used to using PAP with all planned sleep. The earlier full compliance is achieved, the better long term compliance tends to be. Please note that untreated obstructive sleep apnea may carry additional perioperative morbidity. Patients with significant obstructive sleep apnea should receive perioperative PAP therapy and the surgeons and particularly the anesthesiologist should be informed of the diagnosis and the severity of the sleep disordered breathing. 2. The patient should be cautioned not to drive, work at heights, or operate dangerous or heavy equipment when tired or sleepy. Review and reiteration of good sleep hygiene measures should be pursued with any patient. 3. The patient will be [facility] for discussion of the test results, symptom and treatment compliance review, further management strategies, etc. The referring provider will be notified of the test results.   I certify that I have reviewed the entire raw data recording prior to the issuance of this report in accordance with the Standards of Accreditation of the American Academy of Sleep Medicine (AASM)     Huston FoleySaima Vash Quezada, MD, PhD Diplomat, American Board of Neurology and Sleep Medicine (Neurology and Sleep Medicine)

## 2018-08-15 NOTE — Addendum Note (Signed)
Addended by: Huston FoleyATHAR, Devaney Segers on: 08/15/2018 08:07 AM   Modules accepted: Orders

## 2018-09-28 ENCOUNTER — Encounter: Payer: Self-pay | Admitting: Neurology

## 2018-11-07 ENCOUNTER — Encounter: Payer: Self-pay | Admitting: Neurology

## 2018-11-09 ENCOUNTER — Encounter: Payer: Self-pay | Admitting: Neurology

## 2018-11-09 ENCOUNTER — Ambulatory Visit: Payer: BC Managed Care – PPO | Admitting: Neurology

## 2018-11-09 VITALS — BP 143/95 | HR 81 | Ht 71.0 in | Wt 282.0 lb

## 2018-11-09 DIAGNOSIS — G4733 Obstructive sleep apnea (adult) (pediatric): Secondary | ICD-10-CM

## 2018-11-09 DIAGNOSIS — G478 Other sleep disorders: Secondary | ICD-10-CM | POA: Diagnosis not present

## 2018-11-09 DIAGNOSIS — R400 Somnolence: Secondary | ICD-10-CM

## 2018-11-09 DIAGNOSIS — Z9989 Dependence on other enabling machines and devices: Secondary | ICD-10-CM | POA: Diagnosis not present

## 2018-11-09 MED ORDER — SOLRIAMFETOL HCL 75 MG PO TABS: 75.0000 mg | ORAL_TABLET | Freq: Every day | ORAL | 0 refills | Status: DC

## 2018-11-09 NOTE — Progress Notes (Signed)
suSubjective:    Patient ID: Sean Wolf is a 32 y.o. male.  HPI     Interim history:   Sean Wolf is a 32 year old right-handed gentleman with an underlying medical history of hypertension and obesity, who presents for follow-up consultation of his obstructive sleep apnea, after recent sleep study testing and starting CPAP therapy. The patient is unaccompanied today. I first met him on 07/04/2018 at the request of his dentist, at which time he reported snoring and excessive daytime somnolence, with an Epworth sleepiness score of 19 at the time. He was advised to proceed with a sleep study. He had a baseline sleep study, followed by a CPAP titration study. I went over his test results with him in detail today. Baseline sleep study from 07/19/2018 showed a sleep efficiency of 85.7%, sleep latency delayed at 44 minutes, REM latency 68.5 minutes. He had an increased percentage of slow-wave sleep and an increased percentage of REM sleep. He had a total AHI of 19.8 per hour, REM AHI was 53.6 per hour, supine AHI was 21.7 per hour, average oxygen saturation was 98%, nadir was 78%. He had no significant PLMS. He was advised to proceed with a CPAP titration study to optimize treatment settings and monitor oxygen level properly. He had a CPAP titration on 08/17/2018. Sleep efficiency was 84.7%, sleep latency 61.5 minutes, REM latency 65.5 minutes. He had an increased percentage of REM sleep again noted. He was fitted with a small full face mask due to mouth venting noted in CPAP was initiated at 5 cm and further titrated to 9 cm as the final pressure. On the setting his AHI was 0 per hour with supine REM sleep achieved an O2 nadir of 94%. He had no significant PLMS during this study and based on his test results I prescribed CPAP therapy for home use at a pressure of 9 cm.  Today, 11/09/2018: I reviewed his CPAP compliance data from 10/09/2018 through 11/07/2018, during which time he used his machine every night  with percent used days greater than 4 hours at 87%, indicating very good compliance with an average usage of 6 hours and 47 minutes, residual AHI at goal at 1.3 per hour, leak on the low side with the 95th percentile at 3.4 L/m on a pressure of 9 cm with EPR of 3. He reports that he has been compliant with CPAP. He has not had a dramatic improvement in his daytime somnolence. His Epworth sleepiness score is 15 out of 24 today, previously was 19. He is indicating that he does wake up a little better and easier in the mornings. He tries to be in bed around 8 and typically falls asleep by 8:30 or 9 and has to get up at 5 for work. He tries to make enough time for sleep. He is motivated to continue with treatment but was hoping to feel more improved at this point.   The patient's allergies, current medications, family history, past medical history, past social history, past surgical history and problem list were reviewed and updated as appropriate.   Previously:   07/04/2018: (He) reports snoring and excessive daytime somnolence. I reviewed your office records, which you kindly included. His wife has reported that he has pauses in his breathing and snores loudly. He has also woken up with a sense of gasping for air. He has frequent morning headaches and uses BC powder for his headaches. He denies night to night nocturia except for when he takes his blood pressure  medicine late at night. He is supposed to take 2 pills together daily but sometimes does not take it at all. He has gained weight. Bedtime is around 8 PM and rise time around 6 AM. He has to drive from [facility] to [place] for his work related commute and gets sleepy at the wheel, admits to having dosed off at the wheel.  His Epworth sleepiness score is 19 out of 24 today, fatigue score is 57 out of 63. He is married and lives with his wife and 2 children. He works for [employer]. He is a nonsmoker and does not utilize alcohol on a regular basis, does drink  caffeine in the form of regular soda, about 4-5 cans per day on average. His mother was diagnosed with obstructive sleep apnea and had a CPAP machine, then had weight loss surgery. He reports that his father had alcoholism.  He wakes up typically with his mouth severely dry, he suspects that he is a mouth breather. He also complains of postnasal drip, typically year long.   His Past Medical History Is Significant For: Past Medical History:  Diagnosis Date  . Hypertension     His Past Surgical History Is Significant For: No past surgical history on file.  His Family History Is Significant For: Family History  Problem Relation Age of Onset  . Cancer Mother 50       colon  . Alcohol abuse Father   . Diabetes Father   . Drug abuse Brother   . Stroke Brother        from vegetation due to iv drug use  . Cancer Maternal Grandmother        lymphoma  . Cancer Paternal Grandmother        breast  . Stroke Paternal Grandmother   . Early death Neg Hx   . Hyperlipidemia Neg Hx   . Heart disease Neg Hx     His Social History Is Significant For: Social History   Socioeconomic History  . Marital status: Married    Spouse name: Not on file  . Number of children: Not on file  . Years of education: Not on file  . Highest education level: Not on file  Occupational History    Comment: [employer]  Social Needs  . Financial resource strain: Not on file  . Food insecurity:    Worry: Not on file    Inability: Not on file  . Transportation needs:    Medical: Not on file    Non-medical: Not on file  Tobacco Use  . Smoking status: Never Smoker  . Smokeless tobacco: Never Used  Substance and Sexual Activity  . Alcohol use: No  . Drug use: No  . Sexual activity: Not on file  Lifestyle  . Physical activity:    Days per week: Not on file    Minutes per session: Not on file  . Stress: Not on file  Relationships  . Social connections:    Talks on phone: Not on file    Gets together: Not  on file    Attends religious service: Not on file    Active member of club or organization: Not on file    Attends meetings of clubs or organizations: Not on file    Relationship status: Not on file  Other Topics Concern  . Not on file  Social History Narrative  . Not on file    His Allergies Are:  No Known Allergies:   His Current  Medications Are:  Outpatient Encounter Medications as of 11/09/2018  Medication Sig  . losartan-hydrochlorothiazide (HYZAAR) 50-12.5 MG tablet Take 1 tablet by mouth 2 (two) times daily.  . Solriamfetol HCl (SUNOSI) 75 MG TABS Take 75 mg by mouth daily. Start by taking 1/2 tablet daily for 2 weeks. Then increase to 1 whole tablet daily for 1 week.   No facility-administered encounter medications on file as of 11/09/2018.   :  Review of Systems:  Out of a complete 14 point review of systems, all are reviewed and negative with the exception of these symptoms as listed below: Review of Systems  Neurological:       Pt presents today to discuss his cpap. Pt says that it is easier to wake up after using auto pap.   Epworth Sleepiness Scale 0= would never doze 1= slight chance of dozing 2= moderate chance of dozing 3= high chance of dozing  Sitting and reading: 1 Watching TV: 3 Sitting inactive in a public place (ex. Theater or meeting): 1 As a passenger in a car for an hour without a break: 3 Lying down to rest in the afternoon: 3 Sitting and talking to someone: 1 Sitting quietly after lunch (no alcohol): 2 In a car, while stopped in traffic: 1 Total: 15     Objective:  Neurological Exam  Physical Exam Physical Examination:   Vitals:   11/09/18 0938  BP: (!) 143/95  Pulse: 81    General Examination: The patient is a very pleasant 32 y.o. male in no acute distress. He appears well-developed and well-nourished and well groomed.   HEENT: Normocephalic, atraumatic, pupils are equal, round and reactive to light and accommodation.  Extraocular tracking is good without limitation to gaze excursion or nystagmus noted. Normal smooth pursuit is noted. Hearing is grossly intact. Face is symmetric with normal facial animation and normal facial sensation. Speech is clear with no dysarthria noted. There is no hypophonia. There is no lip, neck/head, jaw or voice tremor. Neck shows FROM. Oropharynx exam reveals: mild mouth dryness, adequate dental hygiene and moderate airway crowding. Tongue protrudes centrally and palate elevates symmetrically.   Chest: Clear to auscultation without wheezing, rhonchi or crackles noted.  Heart: S1+S2+0, regular and normal without murmurs, rubs or gallops noted.   Abdomen: Soft, non-tender and non-distended with normal bowel sounds appreciated on auscultation.  Extremities: There is no pitting edema in the distal lower extremities bilaterally. Pedal pulses are intact.  Skin: Warm and dry without trophic changes noted.  Musculoskeletal: exam reveals no obvious joint deformities, tenderness or joint swelling or erythema.   Neurologically:  Mental status: The patient is awake, alert and oriented in all 4 spheres. His immediate and remote memory, attention, language skills and fund of knowledge are appropriate. There is no evidence of aphasia, agnosia, apraxia or anomia. Speech is clear with normal prosody and enunciation. Thought process is linear. Mood is normal and affect is normal.  Cranial nerves II - XII are as described above under HEENT exam. In addition: shoulder shrug is normal with equal shoulder height noted. Motor exam: Normal bulk, strength and tone is noted. There is no tremor. Romberg is negative. Fine motor skills and coordination: grossly intact.  Cerebellar testing: No dysmetria or intention tremor on finger to nose testing. Heel to shin is unremarkable bilaterally. There is no truncal or gait ataxia.  Sensory exam: intact to light touch in the upper and lower extremities.  Gait,  station and balance: He stands easily. No veering  to one side is noted. No leaning to one side is noted. Posture is age-appropriate and stance is narrow based. Gait shows normal stride length and normal pace. No problems turning are noted. Tandem walk is unremarkable.   Assessment and Plan:  In summary, JUSTUS DROKE is a very pleasant 32 year old male with an underlying medical history of hypertension and obesity, who presents for follow-up consultation of his obstructive sleep apnea and significant hypersomnolence after recent sleep study testing. His baseline sleep study in August 2019 showed moderate to severe obstructive sleep apnea and he had a CPAP titration study in September 2019 with very good resolution of his sleep disordered breathing with CPAP of 9 cm. He has been compliant with his CPAP, we talked about his study results in detail as well as his compliance data. He is highly commended for his treatment adherence. While he does not indicate telltale improvement in his daytime somnolence, he does admit that he is easier to wake up in the mornings, nevertheless he requires 8 hours of sleep to be fully rested to be able to wake up easier in the mornings but unfortunately still indicates significant daytime somnolence with an Epworth sleepiness score is still higher at 15 out of 24. Interestingly he did have quite a bit of REM sleep during both the sleep studies including the diagnostic sleep study without CPAP with a REM percentage of around 32% which then increase to a little above 36% when he was on CPAP therapy during the second study. He did not have quick onset REM during the nighttime sleep studies either time. Nevertheless, I would like to proceed with HLA testing for the narcolepsy marker at this point. He is also advised that at some point we may proceed with additional extended sleep study testing to rule out narcolepsy, in the interim for now he is advised to try Sunosi, for symptomatic  treatment of his residual daytime somnolence. He is strongly advised to be fully compliant with CPAP therapy and we talked about potential side effects and expectations with the new medication. He was given samples for the next 3 weeks and is advised to call us within the next 2 or 3 weeks for an update at which point I will prescribe his wake promoting agent if he has done well with it. He is advised to follow-up in 3 months, we will keep them posted as to his blood test result from today's visit. I answered all his questions today and he was in agreement with the plan.

## 2018-11-09 NOTE — Patient Instructions (Addendum)
Please continue using your CPAP regularly. While your insurance requires that you use CPAP at least 4 hours each night on 70% of the nights, I recommend, that you not skip any nights and use it throughout the night if you can. Getting used to CPAP and staying with the treatment long term does take time and patience and discipline. Untreated obstructive sleep apnea when it is moderate to severe can have an adverse impact on cardiovascular health and raise her risk for heart disease, arrhythmias, hypertension, congestive heart failure, stroke and diabetes. Untreated obstructive sleep apnea causes sleep disruption, nonrestorative sleep, and sleep deprivation. This can have an impact on your day to day functioning and cause daytime sleepiness and impairment of cognitive function, memory loss, mood disturbance, and problems focussing. Using CPAP regularly can improve these symptoms.  You have done a great job keeping good sleep hygiene and using her CPAP regularly. Nevertheless, you are still quite sleepy during the day despite good treatment with CPAP for your significant sleep apnea which was in the moderate to severe range when we tested you.   As discussed, we will try you on a wake promoting medication called Sunosi, which is FDA approved to treat sleepiness in patients with sleep apnea that are treated for it and still experience sleepiness and it is also indicated for narcolepsy. Given that your sleep studies showed an increased percentage of dream sleep/REM sleep, I would also like to do a blood test today to look for a marker for narcolepsy. Having a positive marker for narcolepsy does not actually mean you have narcolepsy but it may make it more likely that you have narcolepsy in conjunction with your clinical symptoms and with your sleep study results. We may at some point reevaluate your sleep for narcolepsy type changes with additional sleep study testing.   Start Sunosi: 75 mg strength, half a pill  each morning for the first 1-2 weeks, then you can increase to 1 whole pill daily thereafter, samples are provided today. Please let us know in the next 2 weeks if you benefit from the medication and I can prescribe it for you at the time.  Please remember that the medication is not indicated to treat the underlying obstructive sleep apnea, you have to continue to be compliant with her CPAP. Common side effects include insomnia, nausea, headache, loss of appetite, anxietyand irritability. Side effects may also include increase in blood pressure and heart rate

## 2018-11-17 ENCOUNTER — Telehealth: Payer: Self-pay

## 2018-11-17 LAB — NARCOLEPSY EVALUATION
HLA-DQ ALPHA: POSITIVE
HLA-DQ BETA: NEGATIVE

## 2018-11-17 MED ORDER — SOLRIAMFETOL HCL 75 MG PO TABS: 75.0000 mg | ORAL_TABLET | Freq: Every day | ORAL | 5 refills | Status: DC

## 2018-11-17 NOTE — Progress Notes (Signed)
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 This does not prove he has narcolepsy, but could be supportive of the Diagnosis. We may proceed with further testing for narcolepsy in the form of extended sleep testing at some point, for now, we [facility]; how does he feel on it? Janene Harveysa

## 2018-11-17 NOTE — Telephone Encounter (Signed)
I called pt and discussed his lab results with him. Pt reports that the sunosi 1/2 tablet daily is working really well for him. He notes that it begins to wear off in the mid-afternoon and is wondering how he will do on the whole tablet daily. He is asking if we can go ahead and write the RX for the sunosi. I advised him that Dr. Frances FurbishAthar will likely want to see how he does on one whole tablet and I will check in with him next week. Pt verbalized understanding of results and recommendations.  Dr. Frances FurbishAthar recommends that we order the sunosi for him on Monday. I called pt to inquire when he starts the one whole tablet of sunosi? No answer, left a message asking him to call me back.

## 2018-11-17 NOTE — Telephone Encounter (Signed)
Pt returned my call. He will start 1 whole tablet of sunosi on 11/23/18 and run out completely on 11/29/2018. He is asking if we can go ahead and send the order for the maintenance dose of sunosi to his pharmacy and get the insurance auth completed so he does not run out of medication. I advised him that I will send this request to Dr. Frances FurbishAthar.

## 2018-11-17 NOTE — Telephone Encounter (Signed)
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 This does not prove he has narcolepsy, but could be supportive of the Diagnosis. We may proceed with further testing for narcolepsy in the form of extended sleep testing at some point, for now, we will continue with [facility]; how does he feel on it? Janene Harveysa

## 2018-11-24 DIAGNOSIS — E669 Obesity, unspecified: Secondary | ICD-10-CM | POA: Insufficient documentation

## 2018-11-24 DIAGNOSIS — F419 Anxiety disorder, unspecified: Secondary | ICD-10-CM | POA: Insufficient documentation

## 2018-11-24 DIAGNOSIS — F32A Depression, unspecified: Secondary | ICD-10-CM | POA: Insufficient documentation

## 2019-02-08 ENCOUNTER — Ambulatory Visit: Payer: BC Managed Care – PPO | Admitting: Neurology

## 2019-02-08 ENCOUNTER — Encounter: Payer: Self-pay | Admitting: Neurology

## 2019-02-08 ENCOUNTER — Other Ambulatory Visit: Payer: Self-pay

## 2019-02-08 VITALS — BP 151/87 | HR 94 | Ht 71.0 in | Wt 275.0 lb

## 2019-02-08 DIAGNOSIS — G4719 Other hypersomnia: Secondary | ICD-10-CM | POA: Diagnosis not present

## 2019-02-08 DIAGNOSIS — F39 Unspecified mood [affective] disorder: Secondary | ICD-10-CM

## 2019-02-08 DIAGNOSIS — G4733 Obstructive sleep apnea (adult) (pediatric): Secondary | ICD-10-CM

## 2019-02-08 DIAGNOSIS — Z9989 Dependence on other enabling machines and devices: Secondary | ICD-10-CM

## 2019-02-08 DIAGNOSIS — R030 Elevated blood-pressure reading, without diagnosis of hypertension: Secondary | ICD-10-CM

## 2019-02-08 NOTE — Patient Instructions (Signed)
Please reduce your soda intake to 1-2 servings per day, increase you water intake to at least 6 cups per day.  We will monitor your symptoms and consider increasing the Sunosi next time, so long as your BP is stable and not as high. Continue with the 75 mg daily as it helps.  Please continue using your CPAP regularly. While your insurance requires that you use CPAP at least 4 hours each night on 70% of the nights, I recommend, that you not skip any nights and use it throughout the night if you can. Getting used to CPAP and staying with the treatment long term does take time and patience and discipline. Untreated obstructive sleep apnea when it is moderate to severe can have an adverse impact on cardiovascular health and raise her risk for heart disease, arrhythmias, hypertension, congestive heart failure, stroke and diabetes. Untreated obstructive sleep apnea causes sleep disruption, nonrestorative sleep, and sleep deprivation. This can have an impact on your day to day functioning and cause daytime sleepiness and impairment of cognitive function, memory loss, mood disturbance, and problems focussing. Using CPAP regularly can improve these symptoms.

## 2019-02-08 NOTE — Progress Notes (Signed)
Subjective:    Patient ID: Sean Wolf is a 33 y.o. male.  HPI     Interim history:   Mr. Hollenbeck is a 33 year old right-handed gentleman with an underlying medical history of hypertension and obesity, who presents for follow-up consultation of his obstructive sleep apnea, and hypersomnolence, on CPAP therapy. The patient is unaccompanied today. I last saw him on 11/09/2018, at which time he was compliant with his CPAP. He had significant residual daytime somnolence. Talked about his sleep study results including his baseline sleep study from 07/19/2018 as well as his CPAP titration study from 08/17/2018. His Epworth sleepiness score was still elevated at 15, down from 19 out of 24 before treatment. 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  Today, 02/08/2019: I reviewed his CPAP compliance data from 01/07/2019 through 02/05/2019 which is a total of 30 days, during which time he used his machine 22 days with percent used days greater than 4 hours at 70% indicating adequate compliance but reduced compliance compared to 3 months ago. Average usage of 7 hours and 20 minutes for days on treatment. Residual AHI at goal at 0.9 per hour, leak on the very low side for the 95th percentile at 0.5 L/m on a pressure of 9 cm with EPR of 2. He reports that sunosi may have increased is BP, in the 150s/90s, was started on amlodipine 5 mg by PCP in January, and also started seeing psych in January, was started on sertraline in Feb, now at 75 mg daily, started with 25 mg, seeing a mental health specialist and counselor. He started Buspar a week ago. He has anxiety and depression are a little better. Had recent health scare with wife, and was in hospital for a week, that's when he did not use his machine. Drinks caffeine, in the form of regular soda, 3-4 cans per day, no  actual water intake, he takes his medications with soda as well. He has not taken his blood pressure medication today. He reports that the [facility] has been helping, in fact, when he has forgotten to take it a day or 2 here and there he noticed a flareup of his sleepiness. It is not as effective as in the beginning he feels. He's wondering if we could increase it.   The patient's allergies, current medications, family history, past medical history, past social history, past surgical history and problem list were reviewed and updated as appropriate.    Previously:      I first met him on 07/04/2018 at the request of his dentist, at which time he reported snoring and excessive daytime somnolence, with an Epworth sleepiness score of 19 at the time. He was advised to proceed with a sleep study. He had a baseline sleep study, followed by a CPAP titration study. I went over his test results with him in detail today. Baseline sleep study from 07/19/2018 showed a sleep efficiency of 85.7%, sleep latency delayed at 44 minutes, REM latency 68.5 minutes. He had an increased percentage of slow-wave sleep and an increased percentage of REM sleep. He had a total AHI of 19.8 per hour, REM AHI was 53.6 per hour, supine AHI was 21.7 per hour, average oxygen saturation was 98%, nadir was 78%. He had no significant PLMS. He was advised to proceed with a CPAP titration  study to optimize treatment settings and monitor oxygen level properly. He had a CPAP titration on 08/17/2018. Sleep efficiency was 84.7%, sleep latency 61.5 minutes, REM latency 65.5 minutes. He had an increased percentage of REM sleep again noted. He was fitted with a small full face mask due to mouth venting noted in CPAP was initiated at 5 cm and further titrated to 9 cm as the final pressure. On the setting his AHI was 0 per hour with supine REM sleep achieved an O2 nadir of 94%. He had no significant PLMS during this study and based on his test results I  prescribed CPAP therapy for home use at a pressure of 9 cm.   I reviewed his CPAP compliance data from 10/09/2018 through 11/07/2018, during which time he used his machine every night with percent used days greater than 4 hours at 87%, indicating very good compliance with an average usage of 6 hours and 47 minutes, residual AHI at goal at 1.3 per hour, leak on the low side with the 95th percentile at 3.4 L/m on a pressure of 9 cm with EPR of 3.    07/04/2018: (He) reports snoring and excessive daytime somnolence. I reviewed your office records, which you kindly included. His wife has reported that he has pauses in his breathing and snores loudly. He has also woken up with a sense of gasping for air. He has frequent morning headaches and uses BC powder for his headaches. He denies night to night nocturia except for when he takes his blood pressure medicine late at night. He is supposed to take 2 pills together daily but sometimes does not take it at all. He has gained weight. Bedtime is around 8 PM and rise time around 6 AM. He has to drive from [place] to [place] for his work related commute and gets sleepy at the wheel, admits to having dosed off at the wheel.  His Epworth sleepiness score is 19 out of 24 today, fatigue score is 57 out of 63. He is married and lives with his wife and 2 children. He works for [employer]. He is a nonsmoker and does not utilize alcohol on a regular basis, does drink caffeine in the form of regular soda, about 4-5 cans per day on average. His mother was diagnosed with obstructive sleep apnea and had a CPAP machine, then had weight loss surgery. He reports that his father had alcoholism.  He wakes up typically with his mouth severely dry, he suspects that he is a mouth breather. He also complains of postnasal drip, typically year long.   His Past Medical History Is Significant For: Past Medical History:  Diagnosis Date  . Hypertension     His Past Surgical History Is  Significant For: No past surgical history on file.  His Family History Is Significant For: Family History  Problem Relation Age of Onset  . Cancer Mother 9       colon  . Alcohol abuse Father   . Diabetes Father   . Drug abuse Brother   . Stroke Brother        from vegetation due to iv drug use  . Cancer Maternal Grandmother        lymphoma  . Cancer Paternal Grandmother        breast  . Stroke Paternal Grandmother   . Early death Neg Hx   . Hyperlipidemia Neg Hx   . Heart disease Neg Hx     His Social History Is Significant  For: Social History   Socioeconomic History  . Marital status: Married    Spouse name: Not on file  . Number of children: Not on file  . Years of education: Not on file  . Highest education level: Not on file  Occupational History    Comment: [employer]  Social Needs  . Financial resource strain: Not on file  . Food insecurity:    Worry: Not on file    Inability: Not on file  . Transportation needs:    Medical: Not on file    Non-medical: Not on file  Tobacco Use  . Smoking status: Never Smoker  . Smokeless tobacco: Never Used  Substance and Sexual Activity  . Alcohol use: No  . Drug use: No  . Sexual activity: Not on file  Lifestyle  . Physical activity:    Days per week: Not on file    Minutes per session: Not on file  . Stress: Not on file  Relationships  . Social connections:    Talks on phone: Not on file    Gets together: Not on file    Attends religious service: Not on file    Active member of club or organization: Not on file    Attends meetings of clubs or organizations: Not on file    Relationship status: Not on file  Other Topics Concern  . Not on file  Social History Narrative  . Not on file    His Allergies Are:  No Known Allergies:   His Current Medications Are:  Outpatient Encounter Medications as of 02/08/2019  Medication Sig  . amLODipine (NORVASC) 5 MG tablet Take 5 mg by mouth daily.  . busPIRone (BUSPAR)  5 MG tablet Take 5 mg by mouth daily.  Marland Kitchen losartan-hydrochlorothiazide (HYZAAR) 50-12.5 MG tablet Take 1 tablet by mouth 2 (two) times daily.  . sertraline (ZOLOFT) 25 MG tablet Take 25 mg by mouth daily.  . sertraline (ZOLOFT) 50 MG tablet Take 50 mg by mouth daily.  . Solriamfetol HCl (SUNOSI) 75 MG TABS Take 75 mg by mouth daily.   No facility-administered encounter medications on file as of 02/08/2019.   :  Review of Systems:  Out of a complete 14 point review of systems, all are reviewed and negative with the exception of these symptoms as listed below: Review of Systems  Neurological:       Pt presents today to discuss his osa and sleepiness. The sunosi is not helping and is increasing his BP. Pt is seeing a psychiatrist and therapist for depression and anxiety.    Objective:  Neurological Exam  Physical Exam Physical Examination:   Vitals:   02/08/19 1041  BP: (!) 151/87  Pulse: 94    Examination: The patient is a very pleasant 33 y.o. male in no acute distress. He appears well-developed and well-nourished and well groomed. He is mildly nervous and fidgety appearing, picking at his skin and chewing on his knuckles.  HEENT:Normocephalic, atraumatic, pupils are equal, round and reactive to light. Extraocular tracking is good without limitation to gaze excursion or nystagmus noted. Normal smooth pursuit is noted. Hearing is grossly intact. Face is symmetric with normal facial animation and normal facial sensation. Speech is clear with no dysarthria noted. There is no hypophonia. There is no lip, neck/head, jaw or voice tremor. Neck shows FROM. Oropharynx exam reveals:moderatemouth dryness, adequatedental hygiene and moderateairway crowding. Tongue protrudes centrally and palate elevates symmetrically.   Chest:Clear to auscultation without wheezing, rhonchi or crackles  noted.  Heart:S1+S2+0, regular and normal without murmurs, rubs or gallops noted.   Abdomen:Soft,  non-tender and non-distended.  Extremities:There isnopitting edema in the distal lower extremities bilaterally. Pedal pulses are intact.  Skin: Warm and dry without trophic changes noted.  Musculoskeletal: exam reveals no obvious joint deformities, tenderness or joint swelling or erythema.   Neurologically:  Mental status: The patient is awake, alert and oriented in all 4 spheres.Hisimmediate and remote memory, attention, language skills and fund of knowledge are appropriate. There is no evidence of aphasia, agnosia, apraxia or anomia. Speech is clear with normal prosody and enunciation. Thought process is linear. Mood is normaland affect is normal.  Cranial nerves II - XII are as described above under HEENT exam. Motor exam: Normal bulk, strength and tone is noted. There is no tremor. Romberg is negative. Fine motor skills and coordination: grossly intact.  Cerebellar testing: No dysmetria or intention tremor on finger to nose testing. Heel to shin is unremarkable bilaterally. There is no truncal or gait ataxia.  Sensory exam: intact to light touchin the upper and lower extremities.  Gait, station and balance:Hestands easily. No veering to one side is noted. No leaning to one side is noted. Posture is age-appropriate and stance is narrow based. Gait showsnormalstride length and normalpace. No problems turning are noted. Tandem walk is unremarkable.   Assessmentand Plan:  In summary,Eusevio B Goreis a very pleasant 19 year oldmalewith an underlying medical history of hypertension and obesity, who presents for follow-up consultation of his obstructive sleep apnea and associated residual hypersomnolence. His baseline sleep study from August 2019 showed moderate to severe obstructive sleep apnea and he had good results during his titration study from September 2019. He is on CPAP of 9 cm. He has been compliant with treatment with the exception of some recent lapses and treatment,  primarily because of his wife's hospitalization and health issues. He has been struggling with anxiety and depression. He has noted more consistently elevated blood pressure values. He is wondering if the [facility] raised his blood pressure. He has evidence of elevated blood pressure values before he started this medication but certainly this medication does have hypertension or elevated blood pressure values as one of the side effects and I explained this to him. It has helped, he continues to take 75 mg. He is wondering if he could increase it, I would favor waiting things out a little bit longer. He was recently started on a new blood pressure medication, namely amlodipine 5 mg and he has also been recently started on 2 additional medications, namely sertraline which is currently at 75 mg daily and BuSpar which was started last week. He is reminded to be fully compliant with his CPAP and try not to skip any days. I explained to him that we could potentially consider increasing the Sunosi next time, so long as his blood pressure continues to improve. He is also advised that some of his medications may take longer to take effect such as his depression medication. Of note, we also checked his blood work for HLA markers for narcolepsy at the last visit in December and one of the markers actually came back positive. Of note, both sleep studies showed red percentages in the 30% range. Of note, increase in REM sleep can also be seen in patients who have significant depression. We will continue to monitor his symptoms and his progress. I suggested a recheck in 3 months, sooner if needed. I answered all his questions today and he was  in agreement. I spent 25 minutes in total face-to-face time with the patient, more than 50% of which was spent in counseling and coordination of care, reviewing test results, reviewing medication and discussing or reviewing the diagnosis of OSA, EDS, the prognosis and treatment options.  Pertinent laboratory and imaging test results that were available during this visit with the patient were reviewed by me and considered in my medical decision making (see chart for details).

## 2019-03-30 ENCOUNTER — Telehealth: Payer: Self-pay

## 2019-03-30 NOTE — Telephone Encounter (Signed)
Received a pa request for sunosi. Completed PA for sunosi via covermymeds. KEY: [id number]. Sent to covermymeds. Should have a determination in 1-3 business days.

## 2019-03-30 NOTE — Telephone Encounter (Signed)
PA for sunosi was denied. "You do not meet the requirements of your plan. Your plan approved Sunosi criteria covers this drug when you have tried armodafinil or modafinil and it did not work for you, or you cannot use it. Your request has been denied based on the information we have."  I called pt. [facility] one. He was using [facility] copay card with [facility]. I advised him that his insurance denied coverage of sunosi because he has not tried armodafinil or modafinil. [facility] will contact [facility] and ask them to accept his copay card which expires 10/2019 and let us know if he has further problems.

## 2019-05-08 ENCOUNTER — Telehealth: Payer: Self-pay | Admitting: Neurology

## 2019-05-08 NOTE — Telephone Encounter (Signed)
Due to current COVID 19 pandemic, our office is severely reducing in office visits until further notice, in order to minimize the risk to our patients and healthcare providers.  ° °Called patient to offer virtual visit for 6/10 appt. Patient accepted and verbalized understanding of the doxy process. I have sent patient an e-mail with link and instructions, as well as my name and office number/hours. Patient understands that he will receive a call from RN prior to appt. ° °Pt understands that although there may be some limitations with this type of visit, we will take all precautions to reduce any security or privacy concerns.  Pt understands that this will be treated like an in office visit and we will file with pt's insurance, and there may be a patient responsible charge related to this service. °

## 2019-05-09 NOTE — Telephone Encounter (Signed)
I called pt to update his chart. No answer, left a message asking him to call me back. 

## 2019-05-09 NOTE — Telephone Encounter (Signed)
I called pt, he needs to cancel his appt for tomorrow. I advised him of the no show fee. He will call us back to reschedule.

## 2019-05-10 ENCOUNTER — Ambulatory Visit: Payer: BC Managed Care – PPO | Admitting: Neurology

## 2019-05-11 ENCOUNTER — Ambulatory Visit: Payer: BC Managed Care – PPO | Admitting: Neurology

## 2019-06-05 ENCOUNTER — Telehealth: Payer: Self-pay

## 2019-06-05 NOTE — Telephone Encounter (Signed)
Received another PA request for sunosi. Completed via covermymeds. Key: [id number]. Sent to [facility] for review. Should have a determination in 1-3 business days.

## 2019-06-05 NOTE — Telephone Encounter (Signed)
Received approval from [facility] for pt's sunosi. 06/05/2019 - 06/04/2020. PA # [id number]

## 2019-07-12 ENCOUNTER — Other Ambulatory Visit: Payer: Self-pay | Admitting: Neurology

## 2019-10-08 ENCOUNTER — Other Ambulatory Visit: Payer: Self-pay | Admitting: Neurology

## 2019-10-13 ENCOUNTER — Other Ambulatory Visit: Payer: Self-pay | Admitting: Diagnostic Neuroimaging

## 2019-10-30 ENCOUNTER — Encounter: Payer: Self-pay | Admitting: Neurology

## 2019-10-30 ENCOUNTER — Ambulatory Visit: Payer: BC Managed Care – PPO | Admitting: Neurology

## 2019-10-30 ENCOUNTER — Other Ambulatory Visit: Payer: Self-pay

## 2019-10-30 VITALS — BP 138/88 | HR 96 | Temp 97.4°F | Ht 71.0 in | Wt 273.0 lb

## 2019-10-30 DIAGNOSIS — G4730 Sleep apnea, unspecified: Secondary | ICD-10-CM | POA: Diagnosis not present

## 2019-10-30 DIAGNOSIS — G4733 Obstructive sleep apnea (adult) (pediatric): Secondary | ICD-10-CM | POA: Diagnosis not present

## 2019-10-30 DIAGNOSIS — G4710 Hypersomnia, unspecified: Secondary | ICD-10-CM

## 2019-10-30 DIAGNOSIS — Z9989 Dependence on other enabling machines and devices: Secondary | ICD-10-CM

## 2019-10-30 MED ORDER — SUNOSI 150 MG PO TABS: 150.0000 mg | ORAL_TABLET | Freq: Every day | ORAL | 5 refills | Status: DC

## 2019-10-30 NOTE — Patient Instructions (Signed)
Please continue to work on weight loss, please also monitor your blood pressure once you are on 150 mg of Sunosi.  We will restart your SunosiAt 75 mg which is half a pill for the first week and then you can increase to 1 pill once daily of the 150 mg strength.  Please follow-up in 3 months with a nurse practitioner and hopefully if you do well we can see you in 6 months or once a year even. Keep up the good work with your CPAP machine.  You are doing great with it.

## 2019-10-30 NOTE — Progress Notes (Signed)
Subjective:    Patient ID: Sean Wolf is a 33 y.o. male.  HPI     Interim history:     Mr. Willden is a 33 year old right-handed gentleman with an underlying medical history of hypertension and obesity, who presents for follow-up consultation of his obstructive sleep apnea, and hypersomnolence, on CPAP therapy and Sunosi for sleepiness. The patient is unaccompanied today. I last saw him on 02/08/2019, at which time he reported elevated blood pressure values in the 150s over 90s.  He had started amlodipine recently in January 2020.  He also was started on an antidepressant, sertraline as well as BuSpar.  He was followed by mental health and was seeing a counselor for his anxiety and depression which had flared up as well.  He was taking Sunosi 75 mg daily.  He was advised to monitor his blood pressure values and if they started to improve and were stable, we could consider increasing it for his daytime somnolence.  He was reminded to be fully compliant with his CPAP.    Today, 10/30/2019: I reviewed his CPAP compliance data from 09/25/2019 through 10/24/2019 which is a total of 30 days, during which time he used his CPAP 29 days with percent use days greater than 4 hours at 97%, indicating excellent compliance with an average usage of 7 hours and 50 minutes, residual AHI at goal at 1.1/h, leak low, with a 95th percentile at 0.1 L/min on a pressure of 9 cm with EPR of 2.  He reports that he uses his CPAP consistently.  Nevertheless, he is still sleepy during the day.  He has not been on Sunosi for the past 15 days as he ran out.  His blood pressure is better.  He would like to see about increasing the Sunosi to 150 mg daily as we discussed last time.  He does not monitor his blood pressure at home.  He could borrow his mother's blood pressure cuff to monitor it if needed.  He has had an increase in his sertraline.  He continues to take sertraline and BuSpar and feels stable for the most part, sometimes he  admits he forgets to take his sertraline for a day or 2 and feels that it does have some repercussions but he does not get to the point of stopping it completely or forgetting it more than a day or 2.  He has been working on his weight, he had lost about 15 pounds but gained most of it back.  He is motivated to continue to work on it.  He and his wife have been divorced since October 2020, were separated in March amicably.  They have joint custody over the kids.    The patient's allergies, current medications, family history, past medical history, past social history, past surgical history and problem list were reviewed and updated as appropriate.    Previously:     I saw him on 11/09/2018, at which time he was compliant with his CPAP. He had significant residual daytime somnolence. Talked about his sleep study results including his baseline sleep study from 07/19/2018 as well as his CPAP titration study from 08/17/2018. His Epworth sleepiness score was still elevated at 15, down from 19 out of 24 before treatment. 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   I reviewed his  CPAP compliance data from 01/07/2019 through 02/05/2019 which is a total of 30 days, during which time he used his machine 22 days with percent used days greater than 4 hours at 70% indicating adequate compliance but reduced compliance compared to 3 months ago. Average usage of 7 hours and 20 minutes for days on treatment. Residual AHI at goal at 0.9 per hour, leak on the very low side for the 95th percentile at 0.5 L/m on a pressure of 9 cm with EPR of 2.     I first met him on 07/04/2018 at the request of his dentist, at which time he reported snoring and excessive daytime somnolence, with an Epworth sleepiness score of 19 at the time. He was advised to proceed with a sleep study.  He had a baseline sleep study, followed by a CPAP titration study. I went over his test results with him in detail today. Baseline sleep study from 07/19/2018 showed a sleep efficiency of 85.7%, sleep latency delayed at 44 minutes, REM latency 68.5 minutes. He had an increased percentage of slow-wave sleep and an increased percentage of REM sleep. He had a total AHI of 19.8 per hour, REM AHI was 53.6 per hour, supine AHI was 21.7 per hour, average oxygen saturation was 98%, nadir was 78%. He had no significant PLMS. He was advised to proceed with a CPAP titration study to optimize treatment settings and monitor oxygen level properly. He had a CPAP titration on 08/17/2018. Sleep efficiency was 84.7%, sleep latency 61.5 minutes, REM latency 65.5 minutes. He had an increased percentage of REM sleep again noted. He was fitted with a small full face mask due to mouth venting noted in CPAP was initiated at 5 cm and further titrated to 9 cm as the final pressure. On the setting his AHI was 0 per hour with supine REM sleep achieved an O2 nadir of 94%. He had no significant PLMS during this study and based on his test results I prescribed CPAP therapy for home use at a pressure of 9 cm.   I reviewed his CPAP compliance data from 10/09/2018 through 11/07/2018, during which time he used his machine every night with percent used days greater than 4 hours at 87%, indicating very good compliance with an average usage of 6 hours and 47 minutes, residual AHI at goal at 1.3 per hour, leak on the low side with the 95th percentile at 3.4 L/m on a pressure of 9 cm with EPR of 3.    07/04/2018: (He) reports snoring and excessive daytime somnolence. I reviewed your office records, which you kindly included. His wife has reported that he has pauses in his breathing and snores loudly. He has also woken up with a sense of gasping for air. He has frequent morning headaches and uses BC powder for his headaches. He denies night to night  nocturia except for when he takes his blood pressure medicine late at night. He is supposed to take 2 pills together daily but sometimes does not take it at all. He has gained weight. Bedtime is around 8 PM and rise time around 6 AM. He has to drive from [facility] to [place] for his work related commute and gets sleepy at the wheel, admits to having dosed off at the wheel.  His Epworth sleepiness score is 19 out of 24 today, fatigue score is 57 out of 63. He is married and lives with his wife and 2 children. He works for [employer]. He is a nonsmoker  and does not utilize alcohol on a regular basis, does drink caffeine in the form of regular soda, about 4-5 cans per day on average. His mother was diagnosed with obstructive sleep apnea and had a CPAP machine, then had weight loss surgery. He reports that his father had alcoholism.  He wakes up typically with his mouth severely dry, he suspects that he is a mouth breather. He also complains of postnasal drip, typically year long.  His Past Medical History Is Significant For: Past Medical History:  Diagnosis Date  . Hypertension     His Past Surgical History Is Significant For: History reviewed. No pertinent surgical history.  His Family History Is Significant For: Family History  Problem Relation Age of Onset  . Cancer Mother 64       colon  . Alcohol abuse Father   . Diabetes Father   . Drug abuse Brother   . Stroke Brother        from vegetation due to iv drug use  . Cancer Maternal Grandmother        lymphoma  . Cancer Paternal Grandmother        breast  . Stroke Paternal Grandmother   . Early death Neg Hx   . Hyperlipidemia Neg Hx   . Heart disease Neg Hx     His Social History Is Significant For: Social History   Socioeconomic History  . Marital status: Married    Spouse name: Not on file  . Number of children: Not on file  . Years of education: Not on file  . Highest education level: Not on file  Occupational History     Comment: [employer]  Social Needs  . Financial resource strain: Not on file  . Food insecurity    Worry: Not on file    Inability: Not on file  . Transportation needs    Medical: Not on file    Non-medical: Not on file  Tobacco Use  . Smoking status: Never Smoker  . Smokeless tobacco: Never Used  Substance and Sexual Activity  . Alcohol use: No  . Drug use: No  . Sexual activity: Not on file  Lifestyle  . Physical activity    Days per week: Not on file    Minutes per session: Not on file  . Stress: Not on file  Relationships  . Social Herbalist on phone: Not on file    Gets together: Not on file    Attends religious service: Not on file    Active member of club or organization: Not on file    Attends meetings of clubs or organizations: Not on file    Relationship status: Not on file  Other Topics Concern  . Not on file  Social History Narrative  . Not on file    His Allergies Are:  No Known Allergies:   His Current Medications Are:  Outpatient Encounter Medications as of 10/30/2019  Medication Sig  . amLODipine (NORVASC) 5 MG tablet Take 5 mg by mouth daily.  . busPIRone (BUSPAR) 5 MG tablet Take 5 mg by mouth daily.  Marland Kitchen emtricitabine-tenofovir (TRUVADA) 200-300 MG tablet Take 1 tablet by mouth daily.  Marland Kitchen losartan-hydrochlorothiazide (HYZAAR) 50-12.5 MG tablet Take 1 tablet by mouth 2 (two) times daily.  . sertraline (ZOLOFT) 100 MG tablet Take 100 mg by mouth daily.  . sildenafil (REVATIO) 20 MG tablet Take 20 mg by mouth. Can take up to 5 tablets prn  . Solriamfetol HCl (  SUNOSI) 150 MG TABS Take 150 mg by mouth daily. Take half a pill once daily for the first week, then take 1 pill once daily thereafter  . [DISCONTINUED] sertraline (ZOLOFT) 25 MG tablet Take 25 mg by mouth daily.  . [DISCONTINUED] sertraline (ZOLOFT) 50 MG tablet Take 50 mg by mouth daily.  . [DISCONTINUED] SUNOSI 75 MG TABS TAKE 1 TABLET BY MOUTH EVERY DAY (Patient not taking: Reported on  10/30/2019)   No facility-administered encounter medications on file as of 10/30/2019.   :  Review of Systems:  Out of a complete 14 point review of systems, all are reviewed and negative with the exception of these symptoms as listed below: Review of Systems  Neurological:       Room 1, alone. Last seen 02/08/2019. OSA (DME: [facility]). On Sunosi but ran out and needs refill. He also wants to discuss increasing dose. Previously could not go on higher dose d/t BP not being well controlled.BP today 138/88. HR 96.    Objective:  Neurological Exam  Physical Exam Physical Examination:   Vitals:   10/30/19 1256  BP: 138/88  Pulse: 96  Temp: (!) 97.4 F (36.3 C)  SpO2: 98%    General Examination: The patient is a very pleasant 33 y.o. male in no acute distress. He appears well-developed and well-nourished and well groomed.   HEENT:Normocephalic, atraumatic, pupils are equal, round and reactive to light. Extraocular tracking is good without limitation to gaze excursion or nystagmus noted. Normal smooth pursuit is noted. Hearing is grossly intact. Face is symmetric with normal facial animation and normal facial sensation. Speech is clear with no dysarthria noted. There is no hypophonia. There is no lip, neck/head, jaw or voice tremor. Neckshows FROM.Oropharynx exam reveals:moderatemouth dryness, adequatedental hygiene and moderateairway crowding.Tongue protrudes centrally and palate elevates symmetrically.   Chest:Clear to auscultation without wheezing, rhonchi or crackles noted.  Heart:S1+S2+0, regular and normal without murmurs, rubs or gallops noted.   Abdomen:Soft, non-tender and non-distended.  Extremities:There isnopitting edema in the distal lower extremities bilaterally.   Skin: Warm and dry without trophic changes noted.  Musculoskeletal: exam reveals no obvious joint deformities, tenderness or joint swelling or erythema.   Neurologically:  Mental status:  The patient is awake, alert and oriented in all 4 spheres.Hisimmediate and remote memory, attention, language skills and fund of knowledge are appropriate. There is no evidence of aphasia, agnosia, apraxia or anomia. Speech is clear with normal prosody and enunciation. Thought process is linear. Mood is normaland affect is normal.  Cranial nerves II - XII are as described above under HEENT exam. Motor exam: Normal bulk, strength and tone is noted. There is no tremor. Romberg is negative. Fine motor skills and coordination:grosslyintact.  Cerebellar testing: No dysmetria or intention tremor. There is no truncal or gait ataxia.  Sensory exam: intact to light touchin the upper and lower extremities.  Gait, station and balance:Hestands easily. No veering to one side is noted. No leaning to one side is noted. Posture is age-appropriate and stance is narrow based. Gait showsnormalstride length and normalpace. No problems turning are noted. Tandem walk is unremarkable.   Assessmentand Plan:  In summary,Humza B Goreis a very pleasant15 year oldmalewith an underlying medical history of hypertension and obesity, whopresents for follow-up consultation of his obstructive sleep apnea and hypersomnolence. His baseline sleep study from August 2019 showed moderate to severe obstructive sleep apnea and he had good results during his titration study from September 2019. He is on CPAP of 9 cm. He  has been compliant with treatment. His anxiety and depression are stable.  He has been on sertraline 100 mg and BuSpar.  He is currently off of the Sunosi as he ran out about 2 weeks ago.  We mutually agreed to restart it at 150 mg strength half a pill once daily for the first week and then he is advised to increase it to the full pill once daily thereafter.  He is agreeable to monitoring his blood pressure once he is on the 150 mg strength.  Furthermore, should he have ongoing issues with severe daytime  somnolence despite being compliant with his CPAP and taking the Sunosi 150 mg, we will consider bringing him back for additional sleep study testing in the form of a nocturnal polysomnogram during which he Will use his CPAP that night and the next day nap testing.  In order to do this, he would have to taper off his psychotropic medications including Sunosi, sertraline and BuSpar.  We can certainly discuss this at his next visit.  He was given a new prescription and written instructions.  He was advised to follow-up with the nurse practitioner in 3 months, sooner if needed.  He was encouraged to call with any interim questions or concerns. Of note, we also checked his blood work for HLA markers for narcolepsy at the last visit in December and one of the markers actually came back positive. Of note, both sleep studies showed REM percentages in the 30% range. Of note, increase in REM sleep can also be seen in patients who have significant depression. We will continue to monitor his symptoms and his progress. I suggested a recheck in 3 months, sooner if needed. I answered all his questions today and he was in agreement. I spent 25 minutes in total face-to-face time with the patient, more than 50% of which was spent in counseling and coordination of care, reviewing test results, reviewing medication and discussing or reviewing the diagnosis of OSA, hypersomnolence, the prognosis and treatment options. Pertinent laboratory and imaging test results that were available during this visit with the patient were reviewed by me and considered in my medical decision making (see chart for details).

## 2020-01-31 ENCOUNTER — Ambulatory Visit: Payer: BC Managed Care – PPO | Admitting: Family Medicine

## 2020-02-01 ENCOUNTER — Telehealth (INDEPENDENT_AMBULATORY_CARE_PROVIDER_SITE_OTHER): Payer: BC Managed Care – PPO | Admitting: Family Medicine

## 2020-02-01 ENCOUNTER — Encounter: Payer: Self-pay | Admitting: Family Medicine

## 2020-02-01 DIAGNOSIS — G4733 Obstructive sleep apnea (adult) (pediatric): Secondary | ICD-10-CM | POA: Diagnosis not present

## 2020-02-01 DIAGNOSIS — G4710 Hypersomnia, unspecified: Secondary | ICD-10-CM | POA: Diagnosis not present

## 2020-02-01 DIAGNOSIS — G4730 Sleep apnea, unspecified: Secondary | ICD-10-CM

## 2020-02-01 DIAGNOSIS — Z9989 Dependence on other enabling machines and devices: Secondary | ICD-10-CM | POA: Diagnosis not present

## 2020-02-01 MED ORDER — SUNOSI 150 MG PO TABS: 150.0000 mg | ORAL_TABLET | Freq: Every day | ORAL | 5 refills | Status: DC

## 2020-02-01 NOTE — Progress Notes (Signed)
PATIENT: TALOR CHEEMA DOB: February 08, 1986  REASON FOR VISIT: follow up HISTORY FROM: patient  Virtual Visit via Telephone Note  I connected with Murlean Hark on 02/01/20 at  2:00 PM EST by telephone and verified that I am speaking with the correct person using two identifiers.   I discussed the limitations, risks, security and privacy concerns of performing an evaluation and management service by telephone and the availability of in person appointments. I also discussed with the patient that there may be a patient responsible charge related to this service. The patient expressed understanding and agreed to proceed.   History of Present Illness:  02/01/20 INAKI VANTINE is a 34 y.o. male here today for follow up for OSA on CPAP and hypersomnolence.  He continues compliance with CPAP therapy.  Sunosi was increased to 150 mg daily at last visit in November 2020. He does feel that this has helped some. He is working from home. He is able to complete his work but does report continuing to feel the need to take daytime naps. He is not exercising. He does try to stay active with his kids.   Compliance report dated 01/01/2020 through 01/30/2020 reveals that he used CPAP 27 of the last 30 days for compliance of 90%.  He used CPAP greater than 4 hours all 27 days for compliance of 90%.  Average usage was 8 hours and 57 minutes.  Residual AHI was 0.9 on 9 cm of water and an EPR of 2.  There was no significant leak noted.   History (copied from Dr Guadelupe Sabin note on 10/30/2019)  Mr. Spraggins is a 34 year old right-handed gentleman with an underlying medical history of hypertension and obesity, who presents for follow-up consultation of his obstructive sleep apnea, and hypersomnolence, on CPAP therapy and Sunosi for sleepiness. The patient is unaccompanied today. I last saw him on 02/08/2019, at which time he reported elevated blood pressure values in the 150s over 90s.  He had started amlodipine recently in  January 2020.  He also was started on an antidepressant, sertraline as well as BuSpar.  He was followed by mental health and was seeing a counselor for his anxiety and depression which had flared up as well.  He was taking Sunosi 75 mg daily.  He was advised to monitor his blood pressure values and if they started to improve and were stable, we could consider increasing it for his daytime somnolence.  He was reminded to be fully compliant with his CPAP.    Today, 10/30/2019: I reviewed his CPAP compliance data from 09/25/2019 through 10/24/2019 which is a total of 30 days, during which time he used his CPAP 29 days with percent use days greater than 4 hours at 97%, indicating excellent compliance with an average usage of 7 hours and 50 minutes, residual AHI at goal at 1.1/h, leak low, with a 95th percentile at 0.1 L/min on a pressure of 9 cm with EPR of 2.  He reports that he uses his CPAP consistently.  Nevertheless, he is still sleepy during the day.  He has not been on Sunosi for the past 15 days as he ran out.  His blood pressure is better.  He would like to see about increasing the Sunosi to 150 mg daily as we discussed last time.  He does not monitor his blood pressure at home.  He could borrow his mother's blood pressure cuff to monitor it if needed.  He has had an increase in his  sertraline.  He continues to take sertraline and BuSpar and feels stable for the most part, sometimes he admits he forgets to take his sertraline for a day or 2 and feels that it does have some repercussions but he does not get to the point of stopping it completely or forgetting it more than a day or 2.  He has been working on his weight, he had lost about 15 pounds but gained most of it back.  He is motivated to continue to work on it.  He and his wife have been divorced since October 2020, were separated in March amicably.  They have joint custody over the kids.   The patient's allergies, current medications, family  history, past medical history, past social history, past surgical history and problem list were reviewed and updated as appropriate.  Previously:    I saw him on 11/09/2018, at which time he was compliant with his CPAP. He had significant residual daytime somnolence. Talked about his sleep study results including his baseline sleep study from 07/19/2018 as well as his CPAP titration study from 08/17/2018. His Epworth sleepiness score was still elevated at 15, down from 19 out of 24 before treatment. 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  I reviewed his CPAP compliance data from 01/07/2019 through 02/05/2019 which is a total of 30 days, during which time he used his machine 22 days with percent used days greater than 4 hours at 70% indicating adequate compliance but reduced compliance compared to 3 months ago. Average usage of 7 hours and 20 minutes for days on treatment. Residual AHI at goal at 0.9 per hour, leak on the very low side for the 95thpercentile at 0.5 L/m on a pressure of 9 cm with EPR of 2.   I first met him on 07/04/2018 at the request of his dentist, at which time he reported snoring and excessive daytime somnolence, with an Epworth sleepiness score of 19 at the time. He was advised to proceed with a sleep study. He had a baseline sleep study, followed by a CPAP titration study. I went over his test results with him in detail today. Baseline sleep study from 07/19/2018 showed a sleep efficiency of 85.7%, sleep latency delayed at 44 minutes, REM latency 68.5 minutes. He had an increased percentage of slow-wave sleep and an increased percentage of REM sleep. He had a total AHI of 19.8 per hour, REM AHI was 53.6 per hour, supine AHI was 21.7 per hour, average oxygen saturation was 98%, nadir was 78%. He had no significant PLMS. He  was advised to proceed with a CPAP titration study to optimize treatment settings and monitor oxygen level properly. He had a CPAP titration on 08/17/2018. Sleep efficiency was 84.7%, sleep latency 61.5 minutes, REM latency 65.5 minutes. He had an increased percentage of REM sleep again noted. He was fitted with a small full face mask due to mouth venting noted in CPAP was initiated at 5 cm and further titrated to 9 cm as the final pressure. On the setting his AHI was 0 per hour with supine REM sleep achieved an O2 nadir of 94%. He had no significant PLMS during this study and based on his test results I prescribed CPAP therapy for home use at a pressure of 9 cm.  I reviewed his CPAP  compliance data from 10/09/2018 through 11/07/2018, during which time he used his machine every night with percent used days greater than 4 hours at 87%, indicating very good compliance with an average usage of 6 hours and 47 minutes, residual AHI at goal at 1.3 per hour, leak on the low side with the 95thpercentile at 3.4 L/m on a pressure of 9 cm with EPR of 3.   07/04/2018: (He) reports snoring and excessive daytime somnolence. I reviewed your office records, which you kindly included. His wife has reported that he has pauses in his breathing and snores loudly. He has also woken up with a sense of gasping for air. He has frequent morning headaches and uses BC powder for his headaches. He denies night to night nocturia except for when he takes his blood pressure medicine late at night. He is supposed to take 2 pills together daily but sometimes does not take it at all. He has gained weight. Bedtime is around 8 PM and rise time around 6 AM. He has to drive from [facility] to [place] for his work related commuteand gets sleepy at the wheel, admits to having dosed off at the wheel.  His Epworth sleepiness score is 19 out of 24 today, fatigue score is 57 out of 63. He is married and lives with his wife and 2 children. He works for  [employer]. He is a nonsmoker and does not utilize alcohol on a regular basis, does drink caffeine in the form of regular soda, about 4-5 cans per day on average. His mother was diagnosed with obstructive sleep apnea and had a CPAP machine, then had weight loss surgery. He reports that his father had alcoholism. He wakes up typically with his mouth severely dry, he suspects that he is a mouth breather. He also complains of postnasal drip, typically year long.    Observations/Objective:  Generalized: Well developed, in no acute distress  Mentation: Alert oriented to time, place, history taking. Follows all commands speech and language fluent   Assessment and Plan:  34 y.o. year old male  has a past medical history of Hypertension. here with    ICD-10-CM   1. OSA on CPAP  G47.33    Z99.89   2. Hypersomnia with sleep apnea  G47.10    G47.30    Zakari continues optimal compliance with CPAP therapy.  He denies any concerns today.  He was encouraged to continue using CPAP nightly and for greater than 4 hours each night.  He will continue Sunosi 150 mg daily as this has been somewhat beneficial.  I have encouraged him to consider lifestyle changes with well-balanced diet, regular exercise and adequate hydration.  He will continue to work with primary care for blood pressure and anxiety management.  He will follow-up in 6 months, sooner if needed.  He verbalizes understanding and agreement with this plan.   No orders of the defined types were placed in this encounter.   Meds ordered this encounter  Medications  . Solriamfetol HCl (SUNOSI) 150 MG TABS    Sig: Take 150 mg by mouth daily. Take half a pill once daily for the first week, then take 1 pill once daily thereafter    Dispense:  30 tablet    Refill:  5    Order Specific Question:   Supervising Provider    Answer:   Melvenia Beam [[id number]]     Follow Up Instructions:  I discussed the assessment and treatment plan with the  patient.  The patient was provided an opportunity to ask questions and all were answered. The patient agreed with the plan and demonstrated an understanding of the instructions.   The patient was advised to call back or seek an in-person evaluation if the symptoms worsen or if the condition fails to improve as anticipated.  I provided 20 minutes of non-face-to-face time during this encounter.  Patient is located in his parked car during my chart visit.  Provider is in the office.   Debbora Presto, NP

## 2020-06-05 ENCOUNTER — Encounter: Payer: Self-pay | Admitting: Emergency Medicine

## 2020-06-05 ENCOUNTER — Other Ambulatory Visit: Payer: Self-pay

## 2020-06-05 DIAGNOSIS — Z813 Family history of other psychoactive substance abuse and dependence: Secondary | ICD-10-CM

## 2020-06-05 DIAGNOSIS — Z807 Family history of other malignant neoplasms of lymphoid, hematopoietic and related tissues: Secondary | ICD-10-CM

## 2020-06-05 DIAGNOSIS — I959 Hypotension, unspecified: Secondary | ICD-10-CM | POA: Diagnosis present

## 2020-06-05 DIAGNOSIS — R000 Tachycardia, unspecified: Secondary | ICD-10-CM | POA: Diagnosis present

## 2020-06-05 DIAGNOSIS — N179 Acute kidney failure, unspecified: Principal | ICD-10-CM | POA: Diagnosis present

## 2020-06-05 DIAGNOSIS — Z833 Family history of diabetes mellitus: Secondary | ICD-10-CM

## 2020-06-05 DIAGNOSIS — I10 Essential (primary) hypertension: Secondary | ICD-10-CM | POA: Diagnosis present

## 2020-06-05 DIAGNOSIS — Z20822 Contact with and (suspected) exposure to covid-19: Secondary | ICD-10-CM | POA: Diagnosis present

## 2020-06-05 DIAGNOSIS — E876 Hypokalemia: Secondary | ICD-10-CM | POA: Diagnosis present

## 2020-06-05 DIAGNOSIS — E860 Dehydration: Secondary | ICD-10-CM | POA: Diagnosis present

## 2020-06-05 DIAGNOSIS — K529 Noninfective gastroenteritis and colitis, unspecified: Secondary | ICD-10-CM | POA: Diagnosis present

## 2020-06-05 DIAGNOSIS — Z823 Family history of stroke: Secondary | ICD-10-CM

## 2020-06-05 DIAGNOSIS — Z811 Family history of alcohol abuse and dependence: Secondary | ICD-10-CM

## 2020-06-05 DIAGNOSIS — Z79899 Other long term (current) drug therapy: Secondary | ICD-10-CM

## 2020-06-05 DIAGNOSIS — R1110 Vomiting, unspecified: Secondary | ICD-10-CM | POA: Diagnosis not present

## 2020-06-05 LAB — URINALYSIS, COMPLETE (UACMP) WITH MICROSCOPIC
Bilirubin Urine: NEGATIVE
Glucose, UA: NEGATIVE mg/dL
Hgb urine dipstick: NEGATIVE
Ketones, ur: NEGATIVE mg/dL
Leukocytes,Ua: NEGATIVE
Nitrite: NEGATIVE
Protein, ur: 30 mg/dL — AB
Specific Gravity, Urine: 1.016 (ref 1.005–1.030)
pH: 5 (ref 5.0–8.0)

## 2020-06-05 LAB — CBC
HCT: 37 % — ABNORMAL LOW (ref 39.0–52.0)
Hemoglobin: 13.7 g/dL (ref 13.0–17.0)
MCH: 29.8 pg (ref 26.0–34.0)
MCHC: 37 g/dL — ABNORMAL HIGH (ref 30.0–36.0)
MCV: 80.6 fL (ref 80.0–100.0)
Platelets: 205 10*3/uL (ref 150–400)
RBC: 4.59 MIL/uL (ref 4.22–5.81)
RDW: 13.7 % (ref 11.5–15.5)
WBC: 10.1 10*3/uL (ref 4.0–10.5)
nRBC: 0 % (ref 0.0–0.2)

## 2020-06-05 LAB — COMPREHENSIVE METABOLIC PANEL
ALT: 39 U/L (ref 0–44)
AST: 42 U/L — ABNORMAL HIGH (ref 15–41)
Albumin: 3.8 g/dL (ref 3.5–5.0)
Alkaline Phosphatase: 88 U/L (ref 38–126)
Anion gap: 10 (ref 5–15)
BUN: 25 mg/dL — ABNORMAL HIGH (ref 6–20)
CO2: 30 mmol/L (ref 22–32)
Calcium: 8.8 mg/dL — ABNORMAL LOW (ref 8.9–10.3)
Chloride: 97 mmol/L — ABNORMAL LOW (ref 98–111)
Creatinine, Ser: 2.63 mg/dL — ABNORMAL HIGH (ref 0.61–1.24)
GFR calc Af Amer: 36 mL/min — ABNORMAL LOW (ref >60)
GFR calc non Af Amer: 31 mL/min — ABNORMAL LOW (ref >60)
Glucose, Bld: 114 mg/dL — ABNORMAL HIGH (ref 70–99)
Potassium: 3.4 mmol/L — ABNORMAL LOW (ref 3.5–5.1)
Sodium: 137 mmol/L (ref 135–145)
Total Bilirubin: 1.4 mg/dL — ABNORMAL HIGH (ref 0.3–1.2)
Total Protein: 7.2 g/dL (ref 6.5–8.1)

## 2020-06-05 LAB — LIPASE, BLOOD: Lipase: 24 U/L (ref 11–51)

## 2020-06-05 MED ORDER — SODIUM CHLORIDE 0.9 % IV BOLUS
1000.0000 mL | Freq: Once | INTRAVENOUS | Status: AC
  Administered 2020-06-05: 1000 mL via INTRAVENOUS

## 2020-06-05 MED ORDER — ONDANSETRON 4 MG PO TBDP
4.0000 mg | ORAL_TABLET | Freq: Once | ORAL | Status: AC | PRN
  Administered 2020-06-05: 4 mg via ORAL
  Filled 2020-06-05: qty 1

## 2020-06-05 NOTE — ED Triage Notes (Signed)
Pt c/o N/V/D x4 days. Pt reports general malaise x2 weeks. Pt denies fevers, cough, SOB.

## 2020-06-06 ENCOUNTER — Inpatient Hospital Stay
Admission: EM | Admit: 2020-06-06 | Discharge: 2020-06-10 | DRG: 684 | Disposition: A | Discharge status: Home / Self Care | Payer: BC Managed Care – PPO | Attending: Internal Medicine | Admitting: Internal Medicine

## 2020-06-06 ENCOUNTER — Other Ambulatory Visit: Payer: Self-pay

## 2020-06-06 ENCOUNTER — Emergency Department: Payer: BC Managed Care – PPO

## 2020-06-06 ENCOUNTER — Encounter: Payer: Self-pay | Admitting: Internal Medicine

## 2020-06-06 DIAGNOSIS — Z813 Family history of other psychoactive substance abuse and dependence: Secondary | ICD-10-CM | POA: Diagnosis not present

## 2020-06-06 DIAGNOSIS — R000 Tachycardia, unspecified: Secondary | ICD-10-CM | POA: Diagnosis present

## 2020-06-06 DIAGNOSIS — N179 Acute kidney failure, unspecified: Secondary | ICD-10-CM

## 2020-06-06 DIAGNOSIS — E861 Hypovolemia: Secondary | ICD-10-CM

## 2020-06-06 DIAGNOSIS — Z833 Family history of diabetes mellitus: Secondary | ICD-10-CM | POA: Diagnosis not present

## 2020-06-06 DIAGNOSIS — I959 Hypotension, unspecified: Secondary | ICD-10-CM | POA: Diagnosis present

## 2020-06-06 DIAGNOSIS — K529 Noninfective gastroenteritis and colitis, unspecified: Secondary | ICD-10-CM | POA: Diagnosis present

## 2020-06-06 DIAGNOSIS — E860 Dehydration: Secondary | ICD-10-CM | POA: Diagnosis present

## 2020-06-06 DIAGNOSIS — Z807 Family history of other malignant neoplasms of lymphoid, hematopoietic and related tissues: Secondary | ICD-10-CM | POA: Diagnosis not present

## 2020-06-06 DIAGNOSIS — Z811 Family history of alcohol abuse and dependence: Secondary | ICD-10-CM | POA: Diagnosis not present

## 2020-06-06 DIAGNOSIS — Z79899 Other long term (current) drug therapy: Secondary | ICD-10-CM | POA: Diagnosis not present

## 2020-06-06 DIAGNOSIS — E876 Hypokalemia: Secondary | ICD-10-CM | POA: Diagnosis present

## 2020-06-06 DIAGNOSIS — Z823 Family history of stroke: Secondary | ICD-10-CM | POA: Diagnosis not present

## 2020-06-06 DIAGNOSIS — R1110 Vomiting, unspecified: Secondary | ICD-10-CM | POA: Diagnosis present

## 2020-06-06 DIAGNOSIS — I10 Essential (primary) hypertension: Secondary | ICD-10-CM | POA: Diagnosis present

## 2020-06-06 DIAGNOSIS — Z20822 Contact with and (suspected) exposure to covid-19: Secondary | ICD-10-CM | POA: Diagnosis present

## 2020-06-06 LAB — HIV ANTIBODY (ROUTINE TESTING W REFLEX): HIV Screen 4th Generation wRfx: NONREACTIVE

## 2020-06-06 LAB — BASIC METABOLIC PANEL
Anion gap: 8 (ref 5–15)
BUN: 28 mg/dL — ABNORMAL HIGH (ref 6–20)
CO2: 28 mmol/L (ref 22–32)
Calcium: 7.7 mg/dL — ABNORMAL LOW (ref 8.9–10.3)
Chloride: 100 mmol/L (ref 98–111)
Creatinine, Ser: 2.76 mg/dL — ABNORMAL HIGH (ref 0.61–1.24)
GFR calc Af Amer: 34 mL/min — ABNORMAL LOW (ref >60)
GFR calc non Af Amer: 29 mL/min — ABNORMAL LOW (ref >60)
Glucose, Bld: 83 mg/dL (ref 70–99)
Potassium: 2.8 mmol/L — ABNORMAL LOW (ref 3.5–5.1)
Sodium: 136 mmol/L (ref 135–145)

## 2020-06-06 LAB — LACTIC ACID, PLASMA: Lactic Acid, Venous: 0.8 mmol/L (ref 0.5–1.9)

## 2020-06-06 LAB — CK: Total CK: 70 U/L (ref 49–397)

## 2020-06-06 LAB — SARS CORONAVIRUS 2 BY RT PCR (HOSPITAL ORDER, PERFORMED IN ~~LOC~~ HOSPITAL LAB): SARS Coronavirus 2: NEGATIVE

## 2020-06-06 LAB — PROCALCITONIN: Procalcitonin: 1.67 ng/mL

## 2020-06-06 MED ORDER — POTASSIUM CHLORIDE 10 MEQ/100ML IV SOLN
10.0000 meq | INTRAVENOUS | Status: AC
  Administered 2020-06-06 (×3): 10 meq via INTRAVENOUS
  Filled 2020-06-06 (×3): qty 100

## 2020-06-06 MED ORDER — MORPHINE SULFATE (PF) 2 MG/ML IV SOLN
2.0000 mg | INTRAVENOUS | Status: DC | PRN
  Administered 2020-06-06 – 2020-06-08 (×10): 2 mg via INTRAVENOUS
  Filled 2020-06-06 (×10): qty 1

## 2020-06-06 MED ORDER — ACETAMINOPHEN 650 MG RE SUPP: 650.0000 mg | Freq: Four times a day (QID) | RECTAL | Status: DC | PRN

## 2020-06-06 MED ORDER — ENOXAPARIN SODIUM 40 MG/0.4ML ~~LOC~~ SOLN
40.0000 mg | SUBCUTANEOUS | Status: DC
  Administered 2020-06-07 – 2020-06-09 (×3): 40 mg via SUBCUTANEOUS
  Filled 2020-06-06 (×3): qty 0.4

## 2020-06-06 MED ORDER — ACETAMINOPHEN 325 MG PO TABS
650.0000 mg | ORAL_TABLET | Freq: Four times a day (QID) | ORAL | Status: DC | PRN
  Administered 2020-06-06 – 2020-06-08 (×3): 650 mg via ORAL
  Filled 2020-06-06 (×3): qty 2

## 2020-06-06 MED ORDER — SODIUM CHLORIDE 0.9 % IV SOLN: INTRAVENOUS | Status: DC

## 2020-06-06 MED ORDER — ACETAMINOPHEN 325 MG PO TABS
650.0000 mg | ORAL_TABLET | Freq: Once | ORAL | Status: AC
  Administered 2020-06-06: 650 mg via ORAL
  Filled 2020-06-06: qty 2

## 2020-06-06 MED ORDER — PROMETHAZINE HCL 25 MG/ML IJ SOLN
12.5000 mg | INTRAMUSCULAR | Status: AC
  Administered 2020-06-06: 12.5 mg via INTRAVENOUS
  Filled 2020-06-06: qty 1

## 2020-06-06 MED ORDER — POTASSIUM CHLORIDE 20 MEQ PO PACK
40.0000 meq | PACK | Freq: Two times a day (BID) | ORAL | Status: DC
  Administered 2020-06-06 (×2): 40 meq via ORAL
  Filled 2020-06-06 (×3): qty 2

## 2020-06-06 MED ORDER — ONDANSETRON HCL 4 MG PO TABS: 4.0000 mg | ORAL_TABLET | Freq: Four times a day (QID) | ORAL | Status: DC | PRN

## 2020-06-06 MED ORDER — ONDANSETRON HCL 4 MG/2ML IJ SOLN
4.0000 mg | Freq: Four times a day (QID) | INTRAMUSCULAR | Status: DC | PRN
  Administered 2020-06-06 – 2020-06-10 (×6): 4 mg via INTRAVENOUS
  Filled 2020-06-06 (×6): qty 2

## 2020-06-06 MED ORDER — ONDANSETRON HCL 4 MG/2ML IJ SOLN
4.0000 mg | Freq: Once | INTRAMUSCULAR | Status: AC
  Administered 2020-06-06: 4 mg via INTRAVENOUS
  Filled 2020-06-06: qty 2

## 2020-06-06 MED ORDER — SODIUM CHLORIDE 0.9 % IV BOLUS
1000.0000 mL | Freq: Once | INTRAVENOUS | Status: AC
  Administered 2020-06-06: 1000 mL via INTRAVENOUS

## 2020-06-06 MED ORDER — HYDROCODONE-ACETAMINOPHEN 5-325 MG PO TABS
1.0000 | ORAL_TABLET | ORAL | Status: DC | PRN
  Administered 2020-06-08: 20:00:00 1 via ORAL
  Administered 2020-06-09 (×4): 2 via ORAL
  Administered 2020-06-10 (×2): 1 via ORAL
  Administered 2020-06-10: 2 via ORAL
  Filled 2020-06-06 (×2): qty 1
  Filled 2020-06-06 (×2): qty 2
  Filled 2020-06-06: qty 1
  Filled 2020-06-06 (×3): qty 2

## 2020-06-06 NOTE — H&P (Signed)
History and Physical    Sean Wolf:[id number] DOB: 10/20/1986 DOA: 06/06/2020  PCP: Dione Housekeeper, MD   Patient coming from: Home  I have personally briefly reviewed patient's old medical records in [facility]  Chief Complaint: Nausea vomiting x2 weeks, weakness  HPI: Sean Wolf is a 34 y.o. male with medical history significant for hypertension who presents to the emergency room with a 2-week history of nausea, vomiting and intermittent diarrhea, associated with generalized malaise, subjective fevers.  Denies abdominal pain.  Denies cough or shortness of breath. ED Course: On arrival in the emergency room he was hypotensive, with BP as low as 82/44, tachycardic at 115 and low-grade temperature of 99.2.  Blood work showed normal WBC and hemoglobin.  Lactic acid not done.  Creatinine elevated at 2.63.  Patient was hydrated with 3 L normal saline and repeat creatinine was 2.76.  CT abdomen and pelvis showed no acute findings.  Hospitalist consulted for admission.  Review of Systems: As per HPI otherwise all other systems on review of systems negative.    Past Medical History:  Diagnosis Date  . Hypertension     History reviewed. No pertinent surgical history.   reports that he has never smoked. He has never used smokeless tobacco. He reports that he does not drink alcohol and does not use drugs.  No Known Allergies  Family History  Problem Relation Age of Onset  . Cancer Mother 28       colon  . Alcohol abuse Father   . Diabetes Father   . Drug abuse Brother   . Stroke Brother        from vegetation due to iv drug use  . Cancer Maternal Grandmother        lymphoma  . Cancer Paternal Grandmother        breast  . Stroke Paternal Grandmother   . Early death Neg Hx   . Hyperlipidemia Neg Hx   . Heart disease Neg Hx       Prior to Admission medications   Medication Sig Start Date End Date Taking? Authorizing Provider  amLODipine (NORVASC) 5 MG  tablet Take 5 mg by mouth daily.    [provider]  busPIRone (BUSPAR) 5 MG tablet Take 5 mg by mouth daily.    [provider]  losartan-hydrochlorothiazide (HYZAAR) 50-12.5 MG tablet Take 1 tablet by mouth 2 (two) times daily. 11/10/17   Joni Reining, PA-C  sertraline (ZOLOFT) 100 MG tablet Take 100 mg by mouth daily.    [provider]  sildenafil (REVATIO) 20 MG tablet Take 20 mg by mouth. Can take up to 5 tablets prn    [provider]  Solriamfetol HCl (SUNOSI) 150 MG TABS Take 150 mg by mouth daily. Take half a pill once daily for the first week, then take 1 pill once daily thereafter 02/01/20   Shawnie Dapper, NP    Physical Exam: Vitals:   06/05/20 2321 06/06/20 0011 06/06/20 0250 06/06/20 0315  BP: (!) 86/61 127/76 100/60   Pulse:      Resp:      Temp:    99.4 F (37.4 C)  TempSrc:    Oral  SpO2:         Vitals:   06/05/20 2321 06/06/20 0011 06/06/20 0250 06/06/20 0315  BP: (!) 86/61 127/76 100/60   Pulse:      Resp:      Temp:    99.4 F (37.4  C)  TempSrc:    Oral  SpO2:          Constitutional:  Appears unwell, oriented x 3 . Not in any apparent distress HEENT:      Head: Normocephalic and atraumatic.         Eyes: PERLA, EOMI, Conjunctivae are normal. Sclera is non-icteric.       Mouth/Throat: Mucous membranes are moist.       Neck: Supple with no signs of meningismus. Cardiovascular: Regular rate and rhythm. No murmurs, gallops, or rubs. 2+ symmetrical distal pulses are present . No JVD. No LE edema Respiratory: Respiratory effort normal .Lungs sounds clear bilaterally. No wheezes, crackles, or rhonchi.  Gastrointestinal: Soft, non tender, and non distended with positive bowel sounds. No rebound or guarding. Genitourinary: No CVA tenderness. Musculoskeletal: Nontender with normal range of motion in all extremities. No cyanosis, or erythema of extremities. Neurologic: Normal speech and language. Face is symmetric. Moving all  extremities. No gross focal neurologic deficits . Skin: Skin is warm, dry.  No rash or ulcers Psychiatric: Mood and affect are normal Speech and behavior are normal   Labs on Admission: I have personally reviewed following labs and imaging studies  CBC: Recent Labs  Lab 06/05/20 2026  WBC 10.1  HGB 13.7  HCT 37.0*  MCV 80.6  PLT 205   Basic Metabolic Panel: Recent Labs  Lab 06/05/20 2026 06/06/20 0320  NA 137 136  K 3.4* 2.8*  CL 97* 100  CO2 30 28  GLUCOSE 114* 83  BUN 25* 28*  CREATININE 2.63* 2.76*  CALCIUM 8.8* 7.7*   GFR: CrCl cannot be calculated (Unknown ideal weight.). Liver Function Tests: Recent Labs  Lab 06/05/20 2026  AST 42*  ALT 39  ALKPHOS 88  BILITOT 1.4*  PROT 7.2  ALBUMIN 3.8   Recent Labs  Lab 06/05/20 2026  LIPASE 24   No results for input(s): AMMONIA in the last 168 hours. Coagulation Profile: No results for input(s): INR, PROTIME in the last 168 hours. Cardiac Enzymes: Recent Labs  Lab 06/06/20 0322  CKTOTAL 70   BNP (last 3 results) No results for input(s): PROBNP in the last 8760 hours. HbA1C: No results for input(s): HGBA1C in the last 72 hours. CBG: No results for input(s): GLUCAP in the last 168 hours. Lipid Profile: No results for input(s): CHOL, HDL, LDLCALC, TRIG, CHOLHDL, LDLDIRECT in the last 72 hours. Thyroid Function Tests: No results for input(s): TSH, T4TOTAL, FREET4, T3FREE, THYROIDAB in the last 72 hours. Anemia Panel: No results for input(s): VITAMINB12, FOLATE, FERRITIN, TIBC, IRON, RETICCTPCT in the last 72 hours. Urine analysis:    Component Value Date/Time   COLORURINE AMBER (A) 06/05/2020 2026   APPEARANCEUR CLOUDY (A) 06/05/2020 2026   LABSPEC 1.016 06/05/2020 2026   PHURINE 5.0 06/05/2020 2026   GLUCOSEU NEGATIVE 06/05/2020 2026   HGBUR NEGATIVE 06/05/2020 2026   BILIRUBINUR NEGATIVE 06/05/2020 2026   KETONESUR NEGATIVE 06/05/2020 2026   PROTEINUR 30 (A) 06/05/2020 2026   NITRITE NEGATIVE  06/05/2020 2026   LEUKOCYTESUR NEGATIVE 06/05/2020 2026    Radiological Exams on Admission: CT ABDOMEN PELVIS WO CONTRAST  Result Date: 06/06/2020 CLINICAL DATA:  Acute abdominal pain with nausea and vomiting EXAM: CT ABDOMEN AND PELVIS WITHOUT CONTRAST TECHNIQUE: Multidetector CT imaging of the abdomen and pelvis was performed following the standard protocol without IV contrast. COMPARISON:  None FINDINGS: Lower chest: No acute abnormality. Hepatobiliary: No focal liver abnormality is seen. No gallstones, gallbladder wall thickening, or biliary dilatation.  Pancreas: Unremarkable. No pancreatic ductal dilatation or surrounding inflammatory changes. Spleen: Spleen is within normal limits. Adrenals/Urinary Tract: Adrenal glands are within normal limits bilaterally. Kidneys demonstrate no renal calculi or urinary tract obstructive changes. The ureters are within normal limits. Bladder is partially distended. Stomach/Bowel: Appendix is within normal limits. No obstructive or inflammatory changes of the colon are seen. Small bowel is within normal limits. Stomach is unremarkable. Vascular/Lymphatic: No significant vascular findings are present. No enlarged abdominal or pelvic lymph nodes. Circumaortic left renal vein is noted. Reproductive: Prostate is unremarkable. Other: No abdominal wall hernia or abnormality. No abdominopelvic ascites. Musculoskeletal: No acute or significant osseous findings. IMPRESSION: No acute abnormality noted. Electronically Signed   By: Alcide Clever M.D.   On: 06/06/2020 02:41    EKG: Not done in ER  Assessment/Plan 34 year old male with history of hypertension presenting with 2-week history of nausea vomiting, intermittent diarrhea and generalized malaise    Acute gastroenteritis Possible sepsis -Patient with 2 weeks of nausea vomiting intermittent diarrhea and malaise, low-grade temperature, tachycardia, hypotension and acute kidney injury -CT abdomen and pelvis with no  acute findings -Lactic acid ordered and will initiate sepsis protocol if elevated -Patient received 3 L normal saline in the emergency room -IV hydration, IV antiemetics and supportive care -Follow stool for C. difficile and GI panel    AKI (acute kidney injury) (HCC) -Creatinine 2.63.  Last baseline was normal about a year ago -Creatinine did not improve following 3 L IV fluid bolus -Suspect prerenal/renal hypoperfusion +/- sepsis in view of history of vomiting and diarrhea -CT abdomen and pelvis with no acute findings -Renal ultrasound    Hypotension, in the setting of essential hypertension -Pending sepsis rule out -Continue IV hydration -Hold home antihypertensives    DVT prophylaxis: Lovenox  Code Status: full code  Family Communication:  none  Disposition Plan: Back to previous home environment Consults called: none  Status: Observation    Andris Baumann MD [facility]     06/06/2020, 4:45 AM

## 2020-06-06 NOTE — Progress Notes (Signed)
Patient admitted to the hospital earlier this morning by Dr. Para March  Patient seen and examined.  Continues to have intermittent abdominal pain.  Does not have any further vomiting, but has persistent nausea.  He is tolerating clear liquids.  No further diarrhea.  He feels generally weak.  Abdomen is mild tenderness throughout, lungs are clear bilaterally, cardiac exam is normal. Urine noted to be dark and concentrated.  A/P:  1. Acute gastroenteritis.  Patient was having several days of diarrhea and vomiting prior to admission.  Diarrhea resolved approximately 3 to 4 days ago.  He continued to have nausea and vomiting up until admission.  Imaging including CT abdomen did not show any acute findings.  Stool studies were not sent since he has not had any bowel movements.  Continue supportive treatment with antiemetics.  Continue clear liquids and advance as tolerated. 2. Acute kidney injury.  Baseline creatinine of 1.1 on 05/17/2020.  Creatinine elevated to 2.76.  Suspect is related to hypovolemia/dehydration.  Patient was also is taking ACE inhibitor/hydrochlorothiazide prior to admission which is on hold. Continue with IV fluids and monitor urine output. 3. Hypotension.  In the setting of dehydration.  Chronically on losartan/hydrochlorothiazide, amlodipine.  Will hold in the setting of borderline blood pressures. 4. Hypokalemia, replace. Check Mg  Time spent: 30 mins. >50% of time was spent with face to face encounter with patient. Time also spent reviewing prior records, labs and imaging studies and formulating plan of care.

## 2020-06-06 NOTE — Progress Notes (Signed)
   06/06/20 1828  Assess: MEWS Score  Temp (!) 101.8 F (38.8 C)  Assess: MEWS Score  MEWS Temp 2  MEWS Systolic 0  MEWS Pulse 0  MEWS RR 0  MEWS LOC 0  MEWS Score 2  MEWS Score Color Yellow  Assess: if the MEWS score is Yellow or Red  Were vital signs taken at a resting state? Yes  Focused Assessment Documented focused assessment  Early Detection of Sepsis Score *See Row Information* Low  MEWS guidelines implemented *See Row Information* Yes  Treat  MEWS Interventions Administered prn meds/treatments  Take Vital Signs  Increase Vital Sign Frequency  Yellow: Q 2hr X 2 then Q 4hr X 2, if remains yellow, continue Q 4hrs  Escalate  MEWS: Escalate Yellow: discuss with charge nurse/RN and consider discussing with provider and RRT  Notify: Charge Nurse/RN  Name of Charge Nurse/RN Notified Verl Bangs, RN   Date Charge Nurse/RN Notified 06/06/20  Time Charge Nurse/RN Notified 2585  Notify: Provider  Provider Name/Title Dr. Kerry Hough  Date Provider Notified 06/06/20  Time Provider Notified 1839  Notification Type  (private message)  Notification Reason Change in status  Response No new orders;Other (Comment) (MD stated we will check stool if he starts having diarrhea)  Date of Provider Response 06/06/20 (he just read the message I gave Tylenol )  Time of Provider Response 1840  Document  Patient Outcome Stabilized after interventions;Other (Comment) (99.4)  Progress note created (see row info) Yes

## 2020-06-06 NOTE — ED Provider Notes (Signed)
[facility] Emergency Department Provider Note  ____________________________________________   First MD Initiated Contact with Patient 06/06/20 0007     (approximate)  I have reviewed the triage vital signs and the nursing notes.   HISTORY  Chief Complaint Emesis    HPI Sean Wolf is a 34 y.o. male with history of hypertension presents to the emergency department secondary to nausea vomiting diarrhea x4 days with generalized malaise and weakness.  Patient states that starting on June 24 he started feeling unwell and has never actually returned to his normal state of health.  Patient admits to subjective fevers as well.  Patient denies any abdominal pain.  Patient denies any urinary symptoms no known sick contact.        Past Medical History:  Diagnosis Date  . Hypertension     Patient Active Problem List   Diagnosis Date Noted  . Acute gastroenteritis 06/06/2020  . AKI (acute kidney injury) (HCC) 06/06/2020  . Hypotension 06/06/2020  . Essential hypertension 04/27/2017  . Family hx of colon cancer 04/20/2016    History reviewed. No pertinent surgical history.  Prior to Admission medications   Medication Sig Start Date End Date Taking? Authorizing Provider  amLODipine (NORVASC) 5 MG tablet Take 5 mg by mouth daily.   Yes [provider]  busPIRone (BUSPAR) 15 MG tablet Take 15 mg by mouth 2 (two) times daily. 05/31/20  Yes [provider]  cyanocobalamin (,VITAMIN B-12,) 1000 MCG/ML injection Inject into the muscle. 05/13/20  Yes [provider]  emtricitabine-tenofovir (TRUVADA) 200-300 MG tablet Take by mouth. 04/18/20 07/17/20 Yes [provider]  KLOR-CON M20 20 MEQ tablet Take 20 mEq by mouth 2 (two) times daily. 05/03/20  Yes [provider]  losartan-hydrochlorothiazide (HYZAAR) 100-25 MG tablet Take 1 tablet by mouth daily. 05/16/20  Yes [provider]  sertraline (ZOLOFT) 100 MG tablet  Take 100 mg by mouth daily.   Yes [provider]  Solriamfetol HCl (SUNOSI) 150 MG TABS Take 150 mg by mouth daily. Take half a pill once daily for the first week, then take 1 pill once daily thereafter 02/01/20  Yes Lomax, Amy, NP    Allergies Patient has no known allergies.  Family History  Problem Relation Age of Onset  . Cancer Mother 51       colon  . Alcohol abuse Father   . Diabetes Father   . Drug abuse Brother   . Stroke Brother        from vegetation due to iv drug use  . Cancer Maternal Grandmother        lymphoma  . Cancer Paternal Grandmother        breast  . Stroke Paternal Grandmother   . Early death Neg Hx   . Hyperlipidemia Neg Hx   . Heart disease Neg Hx     Social History Social History   Tobacco Use  . Smoking status: Never Smoker  . Smokeless tobacco: Never Used  Vaping Use  . Vaping Use: Never used  Substance Use Topics  . Alcohol use: No  . Drug use: No    Review of Systems Constitutional: No fever/chills Eyes: No visual changes. ENT: No sore throat. Cardiovascular: Denies chest pain. Respiratory: Denies shortness of breath. Gastrointestinal: No abdominal pain.  Positive for nausea vomiting diarrhea Genitourinary: Negative for dysuria. Musculoskeletal: Negative for neck pain.  Negative for back pain. Integumentary: Negative for rash. Neurological: Negative for headaches, focal weakness or numbness.  ____________________________________________   PHYSICAL EXAM:  VITAL SIGNS: ED Triage Vitals  Enc Vitals Group     BP 06/05/20 2027 107/62     Pulse Rate 06/05/20 2027 (!) 115     Resp 06/05/20 2027 18     Temp 06/05/20 2027 99.2 F (37.3 C)     Temp Source 06/05/20 2027 Oral     SpO2 06/05/20 2027 100 %     Weight --      Height --      Head Circumference --      Peak Flow --      Pain Score 06/05/20 2214 8     Pain Loc --      Pain Edu? --      Excl. in GC? --     Constitutional: Alert and oriented.  Eyes:  Conjunctivae are normal.  Mouth/Throat: Patient is wearing a mask. Neck: No stridor.  No meningeal signs.   Cardiovascular: Normal rate, regular rhythm. Good peripheral circulation. Grossly normal heart sounds. Respiratory: Normal respiratory effort.  No retractions. Gastrointestinal: Soft and nontender. No distention.  Musculoskeletal: No lower extremity tenderness nor edema. No gross deformities of extremities. Neurologic:  Normal speech and language. No gross focal neurologic deficits are appreciated.  Skin:  Skin is warm, dry and intact. Psychiatric: Mood and affect are normal. Speech and behavior are normal.  ____________________________________________   LABS (all labs ordered are listed, but only abnormal results are displayed)  Labs Reviewed  COMPREHENSIVE METABOLIC PANEL - Abnormal; Notable for the following components:      Result Value   Potassium 3.4 (*)    Chloride 97 (*)    Glucose, Bld 114 (*)    BUN 25 (*)    Creatinine, Ser 2.63 (*)    Calcium 8.8 (*)    AST 42 (*)    Total Bilirubin 1.4 (*)    GFR calc non Af Amer 31 (*)    GFR calc Af Amer 36 (*)    All other components within normal limits  CBC - Abnormal; Notable for the following components:   HCT 37.0 (*)    MCHC 37.0 (*)    All other components within normal limits  URINALYSIS, COMPLETE (UACMP) WITH MICROSCOPIC - Abnormal; Notable for the following components:   Color, Urine AMBER (*)    APPearance CLOUDY (*)    Protein, ur 30 (*)    Bacteria, UA RARE (*)    All other components within normal limits  BASIC METABOLIC PANEL - Abnormal; Notable for the following components:   Potassium 2.8 (*)    BUN 28 (*)    Creatinine, Ser 2.76 (*)    Calcium 7.7 (*)    GFR calc non Af Amer 29 (*)    GFR calc Af Amer 34 (*)    All other components within normal limits  SARS CORONAVIRUS 2 BY RT PCR (HOSPITAL ORDER, PERFORMED IN [facility])  GASTROINTESTINAL PANEL BY PCR, STOOL (REPLACES STOOL  CULTURE)  GASTROINTESTINAL PANEL BY PCR, STOOL (REPLACES STOOL CULTURE)  LIPASE, BLOOD  CK  LACTIC ACID, PLASMA  PROCALCITONIN  HIV ANTIBODY (ROUTINE TESTING W REFLEX)  LACTIC ACID, PLASMA    Procedures   ____________________________________________   INITIAL IMPRESSION / MDM / ASSESSMENT AND PLAN / ED COURSE  As part of my medical decision making, I reviewed the following data within the electronic MEDICAL RECORD NUMBER  34 year old male presented with above-stated history and physical exam a differential diagnosis including but not  limited to infectious colitis, gastroenteritis, COVID-19.  Laboratory data notable for creatinine of 2.63 BUN of 25.  Despite receiving 3 L of IV normal saline patient's creatinine actually increased to 2.76 with a BUN of 28.  CT abdomen pelvis revealed no acute intra-abdominal pathology.  COVID-19 testing negative.  Patient discussed with Dr. Para March for hospital admission for further evaluation and management of acute renal injury  ____________________________________________  FINAL CLINICAL IMPRESSION(S) / ED DIAGNOSES  Final diagnoses:  Acute kidney injury (HCC)     MEDICATIONS GIVEN DURING THIS VISIT:  Medications  potassium chloride (KLOR-CON) packet 40 mEq (has no administration in time range)  enoxaparin (LOVENOX) injection 40 mg (has no administration in time range)  0.9 %  sodium chloride infusion (has no administration in time range)  acetaminophen (TYLENOL) tablet 650 mg (has no administration in time range)    Or  acetaminophen (TYLENOL) suppository 650 mg (has no administration in time range)  HYDROcodone-acetaminophen (NORCO/VICODIN) 5-325 MG per tablet 1-2 tablet (has no administration in time range)  morphine 2 MG/ML injection 2 mg (has no administration in time range)  ondansetron (ZOFRAN) tablet 4 mg (has no administration in time range)    Or  ondansetron (ZOFRAN) injection 4 mg (has no administration in time range)  ondansetron  (ZOFRAN-ODT) disintegrating tablet 4 mg (4 mg Oral Given 06/05/20 2035)  sodium chloride 0.9 % bolus 1,000 mL (0 mLs Intravenous Stopped 06/06/20 0251)  sodium chloride 0.9 % bolus 1,000 mL (0 mLs Intravenous Stopped 06/06/20 0251)  ondansetron (ZOFRAN) injection 4 mg (4 mg Intravenous Given 06/06/20 0036)  sodium chloride 0.9 % bolus 1,000 mL (0 mLs Intravenous Stopped 06/06/20 0251)  acetaminophen (TYLENOL) tablet 650 mg (650 mg Oral Given 06/06/20 0314)     ED Discharge Orders    None      *Please note:  Sean Wolf was evaluated in Emergency Department on 06/06/2020 for the symptoms described in the history of present illness. He was evaluated in the context of the global COVID-19 pandemic, which necessitated consideration that the patient might be at risk for infection with the SARS-CoV-2 virus that causes COVID-19. Institutional protocols and algorithms that pertain to the evaluation of patients at risk for COVID-19 are in a state of rapid change based on information released by regulatory bodies including the CDC and federal and state organizations. These policies and algorithms were followed during the patient's care in the ED.  Some ED evaluations and interventions may be delayed as a result of limited staffing during and after the pandemic.*  Note:  This document was prepared using Dragon voice recognition software and may include unintentional dictation errors.   Darci Current, MD 06/06/20 [phone]

## 2020-06-06 NOTE — ED Notes (Signed)
Pt reports sustained headache and mild improvement to nausea

## 2020-06-07 DIAGNOSIS — E876 Hypokalemia: Secondary | ICD-10-CM

## 2020-06-07 LAB — CBC
HCT: 30.5 % — ABNORMAL LOW (ref 39.0–52.0)
Hemoglobin: 11.2 g/dL — ABNORMAL LOW (ref 13.0–17.0)
MCH: 30.1 pg (ref 26.0–34.0)
MCHC: 36.7 g/dL — ABNORMAL HIGH (ref 30.0–36.0)
MCV: 82 fL (ref 80.0–100.0)
Platelets: 165 10*3/uL (ref 150–400)
RBC: 3.72 MIL/uL — ABNORMAL LOW (ref 4.22–5.81)
RDW: 14 % (ref 11.5–15.5)
WBC: 9.4 10*3/uL (ref 4.0–10.5)
nRBC: 0 % (ref 0.0–0.2)

## 2020-06-07 LAB — MAGNESIUM: Magnesium: 1.8 mg/dL (ref 1.7–2.4)

## 2020-06-07 LAB — COMPREHENSIVE METABOLIC PANEL
ALT: 35 U/L (ref 0–44)
AST: 46 U/L — ABNORMAL HIGH (ref 15–41)
Albumin: 2.8 g/dL — ABNORMAL LOW (ref 3.5–5.0)
Alkaline Phosphatase: 81 U/L (ref 38–126)
Anion gap: 10 (ref 5–15)
BUN: 21 mg/dL — ABNORMAL HIGH (ref 6–20)
CO2: 23 mmol/L (ref 22–32)
Calcium: 7.9 mg/dL — ABNORMAL LOW (ref 8.9–10.3)
Chloride: 102 mmol/L (ref 98–111)
Creatinine, Ser: 2.07 mg/dL — ABNORMAL HIGH (ref 0.61–1.24)
GFR calc Af Amer: 48 mL/min — ABNORMAL LOW (ref >60)
GFR calc non Af Amer: 41 mL/min — ABNORMAL LOW (ref >60)
Glucose, Bld: 142 mg/dL — ABNORMAL HIGH (ref 70–99)
Potassium: 2.9 mmol/L — ABNORMAL LOW (ref 3.5–5.1)
Sodium: 135 mmol/L (ref 135–145)
Total Bilirubin: 0.9 mg/dL (ref 0.3–1.2)
Total Protein: 5.4 g/dL — ABNORMAL LOW (ref 6.5–8.1)

## 2020-06-07 LAB — PROCALCITONIN: Procalcitonin: 0.88 ng/mL

## 2020-06-07 MED ORDER — POTASSIUM CHLORIDE 10 MEQ/100ML IV SOLN
10.0000 meq | INTRAVENOUS | Status: AC
  Administered 2020-06-07 (×4): 10 meq via INTRAVENOUS
  Filled 2020-06-07 (×3): qty 100

## 2020-06-07 MED ORDER — POTASSIUM CHLORIDE CRYS ER 20 MEQ PO TBCR
40.0000 meq | EXTENDED_RELEASE_TABLET | Freq: Two times a day (BID) | ORAL | Status: DC
  Administered 2020-06-07 – 2020-06-10 (×8): 40 meq via ORAL
  Filled 2020-06-07 (×9): qty 2

## 2020-06-07 NOTE — Progress Notes (Signed)
PROGRESS NOTE    Sean Wolf  YPP:[id number] DOB: 06-May-1986 DOA: 06/06/2020 PCP: Dione Housekeeper, MD    Brief Narrative:  HPI: Sean Wolf is a 34 y.o. male with medical history significant for hypertension who presents to the emergency room with a 2-week history of nausea, vomiting and intermittent diarrhea, associated with generalized malaise, subjective fevers.  Denies abdominal pain.  Denies cough or shortness of breath. ED Course: On arrival in the emergency room he was hypotensive, with BP as low as 82/44, tachycardic at 115 and low-grade temperature of 99.2.  Blood work showed normal WBC and hemoglobin.  Lactic acid not done.  Creatinine elevated at 2.63.  Patient was hydrated with 3 L normal saline and repeat creatinine was 2.76.  CT abdomen and pelvis showed no acute findings.  Hospitalist consulted for admission.   Assessment & Plan:   Principal Problem:   Acute gastroenteritis Active Problems:   Essential hypertension   AKI (acute kidney injury) (HCC)   Hypotension   Hypokalemia   1. Acute gastroenteritis.  Patient was having several days of diarrhea and vomiting prior to admission.  Overnight he was noted to have fevers. Diarrhea had initially resolved, but he began having loose stools this morning again.  Imaging including CT abdomen did not show any acute findings.  Stool studies have been ordered.  Continue supportive treatment with antiemetics.  Continue clear liquids and advance as tolerated. 2. Acute kidney injury.  Baseline creatinine of 1.1 on 05/17/2020.  Creatinine elevated to 2.76 on admission.  Suspect is related to hypovolemia/dehydration. Patient was also is taking ARB/hydrochlorothiazide prior to admission which is on hold. Continue with IV fluids and monitor urine output. Renal function is improving 3. Hypotension.  In the setting of dehydration.  Chronically on losartan/hydrochlorothiazide, amlodipine.  Will hold in the setting of borderline blood  pressures. 4. Hypokalemia, replace. Mg normal 5. Antiviral therapy. 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 HIV screen was negative   DVT prophylaxis: enoxaparin (LOVENOX) injection 40 mg Start: 06/06/20 2200  Code Status: full code Family Communication: discussed with patient Disposition Plan: Status is: Inpatient  Remains inpatient appropriate because:Inpatient level of care appropriate due to severity of illness. Needs continues IV fluids for renal failure and electrolyte replacement   Dispo: The patient is from: Home              Anticipated d/c is to: Home              Anticipated d/c date is: 1 day              Patient currently is not medically stable to d/c.    Consultants:     Procedures:     Antimicrobials:       Subjective: Continues to feel weak and unwell. Noted to be febrile overnight. Tolerating liquids today, wants to advance diet. Began having loose stools this morning.  Objective: Vitals:   06/07/20 0009 06/07/20 0351 06/07/20 0822 06/07/20 1238  BP: 112/72 109/75 109/69 108/76  Pulse: 83 96 99 93  Resp: 15 15 (!) 22 16  Temp: 98.5 F (36.9 C) 98.9 F (37.2 C) 98.8 F (37.1 C) 99.6 F (37.6 C)  TempSrc: Oral Oral Oral Oral  SpO2: 100% 97% 96% 99%  Weight:      Height:        Intake/Output Summary (Last 24 hours) at 06/07/2020 1409 Last data filed at 06/07/2020 0930 Gross per 24 hour  Intake 1578.48 ml  Output 1800 ml  Net -221.52 ml   Filed Weights   06/06/20 1549  Weight: 121.5 kg    Examination:  General exam: Appears calm and comfortable  Respiratory system: Clear to auscultation. Respiratory effort normal. Cardiovascular system: S1 & S2 heard, RRR. No JVD, murmurs, rubs, gallops or clicks. No pedal edema. Gastrointestinal system: Abdomen is nondistended, soft and nontender. No organomegaly or masses felt. Normal bowel sounds heard. Central nervous system: Alert and oriented. No focal neurological  deficits. Extremities: Symmetric 5 x 5 power. Skin: No rashes, lesions or ulcers Psychiatry: Judgement and insight appear normal. Mood & affect appropriate.     Data Reviewed: I have personally reviewed following labs and imaging studies  CBC: Recent Labs  Lab 06/05/20 2026 06/07/20 0805  WBC 10.1 9.4  HGB 13.7 11.2*  HCT 37.0* 30.5*  MCV 80.6 82.0  PLT 205 165   Basic Metabolic Panel: Recent Labs  Lab 06/05/20 2026 06/06/20 0320 06/07/20 0805  NA 137 136 135  K 3.4* 2.8* 2.9*  CL 97* 100 102  CO2 30 28 23   GLUCOSE 114* 83 142*  BUN 25* 28* 21*  CREATININE 2.63* 2.76* 2.07*  CALCIUM 8.8* 7.7* 7.9*  MG  --   --  1.8   GFR: Estimated Creatinine Clearance: 68.6 mL/min (A) (by C-G formula based on SCr of 2.07 mg/dL (H)). Liver Function Tests: Recent Labs  Lab 06/05/20 2026 06/07/20 0805  AST 42* 46*  ALT 39 35  ALKPHOS 88 81  BILITOT 1.4* 0.9  PROT 7.2 5.4*  ALBUMIN 3.8 2.8*   Recent Labs  Lab 06/05/20 2026  LIPASE 24   No results for input(s): AMMONIA in the last 168 hours. Coagulation Profile: No results for input(s): INR, PROTIME in the last 168 hours. Cardiac Enzymes: Recent Labs  Lab 06/06/20 0322  CKTOTAL 70   BNP (last 3 results) No results for input(s): PROBNP in the last 8760 hours. HbA1C: No results for input(s): HGBA1C in the last 72 hours. CBG: No results for input(s): GLUCAP in the last 168 hours. Lipid Profile: No results for input(s): CHOL, HDL, LDLCALC, TRIG, CHOLHDL, LDLDIRECT in the last 72 hours. Thyroid Function Tests: No results for input(s): TSH, T4TOTAL, FREET4, T3FREE, THYROIDAB in the last 72 hours. Anemia Panel: No results for input(s): VITAMINB12, FOLATE, FERRITIN, TIBC, IRON, RETICCTPCT in the last 72 hours. Sepsis Labs: Recent Labs  Lab 06/06/20 0502 06/07/20 0805  PROCALCITON 1.67 0.88  LATICACIDVEN 0.8  --     Recent Results (from the past 240 hour(s))  SARS Coronavirus 2 by RT PCR (hospital order,  performed in [facility] hospital lab) Nasopharyngeal Nasopharyngeal Swab     Status: None   Collection Time: 06/06/20  5:01 AM   Specimen: Nasopharyngeal Swab  Result Value Ref Range Status   SARS Coronavirus 2 NEGATIVE NEGATIVE Final    Comment: (NOTE) SARS-CoV-2 target nucleic acids are NOT DETECTED.  The SARS-CoV-2 RNA is generally detectable in upper and lower respiratory specimens during the acute phase of infection. The lowest concentration of SARS-CoV-2 viral copies this assay can detect is 250 copies / mL. A negative result does not preclude SARS-CoV-2 infection and should not be used as the sole basis for treatment or other patient management decisions.  A negative result may occur with improper specimen collection / handling, submission of specimen other than nasopharyngeal swab, presence of viral mutation(s) within the areas targeted by this assay, and inadequate number of viral copies (<250 copies / mL). A negative  result must be combined with clinical observations, patient history, and epidemiological information.  Fact Sheet for Patients:   BoilerBrush.com.cy  Fact Sheet for Healthcare Providers: https://pope.com/  This test is not yet approved or  cleared by the [place] FDA and has been authorized for detection and/or diagnosis of SARS-CoV-2 by FDA under an Emergency Use Authorization (EUA).  This EUA will remain in effect (meaning this test can be used) for the duration of the COVID-19 declaration under Section 564(b)(1) of the Act, 21 U.S.C. section 360bbb-3(b)(1), unless the authorization is terminated or revoked sooner.  Performed at [facility], [address] [place], [place] [address]          Radiology Studies: CT ABDOMEN PELVIS WO CONTRAST  Result Date: 06/06/2020 CLINICAL DATA:  Acute abdominal pain with nausea and vomiting EXAM: CT ABDOMEN AND PELVIS WITHOUT CONTRAST TECHNIQUE:  Multidetector CT imaging of the abdomen and pelvis was performed following the standard protocol without IV contrast. COMPARISON:  None FINDINGS: Lower chest: No acute abnormality. Hepatobiliary: No focal liver abnormality is seen. No gallstones, gallbladder wall thickening, or biliary dilatation. Pancreas: Unremarkable. No pancreatic ductal dilatation or surrounding inflammatory changes. Spleen: Spleen is within normal limits. Adrenals/Urinary Tract: Adrenal glands are within normal limits bilaterally. Kidneys demonstrate no renal calculi or urinary tract obstructive changes. The ureters are within normal limits. Bladder is partially distended. Stomach/Bowel: Appendix is within normal limits. No obstructive or inflammatory changes of the colon are seen. Small bowel is within normal limits. Stomach is unremarkable. Vascular/Lymphatic: No significant vascular findings are present. No enlarged abdominal or pelvic lymph nodes. Circumaortic left renal vein is noted. Reproductive: Prostate is unremarkable. Other: No abdominal wall hernia or abnormality. No abdominopelvic ascites. Musculoskeletal: No acute or significant osseous findings. IMPRESSION: No acute abnormality noted. Electronically Signed   By: Alcide Clever M.D.   On: 06/06/2020 02:41        Scheduled Meds: . enoxaparin (LOVENOX) injection  40 mg Subcutaneous Q24H  . potassium chloride  40 mEq Oral BID   Continuous Infusions: . sodium chloride 100 mL/hr at 06/07/20 0600     LOS: 1 day    Time spent:    Erick Blinks, MD [facility]   If 7PM-7AM, please contact night-coverage [email or web address]  06/07/2020, 2:09 PM

## 2020-06-08 LAB — GASTROINTESTINAL PANEL BY PCR, STOOL (REPLACES STOOL CULTURE)

## 2020-06-08 LAB — BASIC METABOLIC PANEL
Anion gap: 5 (ref 5–15)
BUN: 13 mg/dL (ref 6–20)
CO2: 26 mmol/L (ref 22–32)
Calcium: 7.9 mg/dL — ABNORMAL LOW (ref 8.9–10.3)
Chloride: 105 mmol/L (ref 98–111)
Creatinine, Ser: 1.54 mg/dL — ABNORMAL HIGH (ref 0.61–1.24)
GFR calc Af Amer: >60 mL/min (ref >60)
GFR calc non Af Amer: 59 mL/min — ABNORMAL LOW (ref >60)
Glucose, Bld: 108 mg/dL — ABNORMAL HIGH (ref 70–99)
Potassium: 3.4 mmol/L — ABNORMAL LOW (ref 3.5–5.1)
Sodium: 136 mmol/L (ref 135–145)

## 2020-06-08 LAB — C DIFFICILE QUICK SCREEN W PCR REFLEX
C Diff antigen: NEGATIVE
C Diff interpretation: NOT DETECTED
C Diff toxin: NEGATIVE

## 2020-06-08 LAB — PROCALCITONIN: Procalcitonin: 0.61 ng/mL

## 2020-06-08 MED ORDER — POTASSIUM CHLORIDE CRYS ER 20 MEQ PO TBCR
40.0000 meq | EXTENDED_RELEASE_TABLET | Freq: Once | ORAL | Status: AC
  Administered 2020-06-08: 40 meq via ORAL

## 2020-06-08 NOTE — Progress Notes (Signed)
PROGRESS NOTE    Sean Wolf  ITG:[id number] DOB: Sep 28, 1986 DOA: 06/06/2020 PCP: Dione Housekeeper, MD    Brief Narrative:  HPI: Sean Wolf is a 34 y.o. male with medical history significant for hypertension who presents to the emergency room with a 2-week history of nausea, vomiting and intermittent diarrhea, associated with generalized malaise, subjective fevers.  Denies abdominal pain.  Denies cough or shortness of breath. ED Course: On arrival in the emergency room he was hypotensive, with BP as low as 82/44, tachycardic at 115 and low-grade temperature of 99.2.  Blood work showed normal WBC and hemoglobin.  Lactic acid not done.  Creatinine elevated at 2.63.  Patient was hydrated with 3 L normal saline and repeat creatinine was 2.76.  CT abdomen and pelvis showed no acute findings.  Hospitalist consulted for admission.   Assessment & Plan:   Principal Problem:   Acute gastroenteritis Active Problems:   Essential hypertension   AKI (acute kidney injury) (HCC)   Hypotension   Hypokalemia   1. Acute gastroenteritis.  Patient was having several days of diarrhea and vomiting prior to admission.  Overnight he was noted to have fevers (T-max 101.3 at 5:08 PM yesterday).  Reports 1 loose watery bowel movement yesterday.  Imaging including CT abdomen did not show any acute findings.  Stool studies have been ordered but none has been sent out yet, reiterated to nursing and patient importance of stool sample.  Continue supportive treatment with antiemetics.  Tolerating Regular diet 2. Acute kidney injury.  Baseline creatinine of 1.1 on 05/17/2020.  Creatinine elevated to 2.76 on admission.  Suspect is related to hypovolemia/dehydration. Patient was also is taking ARB/hydrochlorothiazide prior to admission which is on hold. Continue with IV fluids and monitor urine output. Renal function is improving, creatinine 1.54 today 3. Hypotension.  In the setting of dehydration.  Chronically on  losartan/hydrochlorothiazide, amlodipine. hold in the setting of borderline blood pressures. 4. Hypokalemia -replete and recheck 5. Antiviral therapy. 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 HIV screen was negative   DVT prophylaxis: enoxaparin (LOVENOX) injection 40 mg Start: 06/06/20 2200  Code Status: full code Family Communication: discussed with patient Disposition Plan: Status is: Inpatient  Remains inpatient appropriate because:Inpatient level of care appropriate due to severity of illness. Needs continues IV fluids for renal failure and electrolyte replacement   Dispo: The patient is from: Home              Anticipated d/c is to: Home              Anticipated d/c date is: 1 day              Patient currently is not medically stable to d/c.  Still febrile and stool studies pending, sample not collected yet    Consultants:     Procedures:     Antimicrobials:       Subjective: Continues to feel weak and unwell. Febrile (T-max 101.3 last evening at 5:08 PM). Tolerating soft diet.  Had one loose bowel movement yesterday Objective: Vitals:   06/08/20 0016 06/08/20 0436 06/08/20 0817 06/08/20 1155  BP: 108/73 (!) 105/56 107/62 109/68  Pulse: 89 95 87 84  Resp: 15 16 17 16   Temp: 98.7 F (37.1 C) (!) 100.8 F (38.2 C) 98.6 F (37 C) 98.4 F (36.9 C)  TempSrc: Oral Oral Oral Oral  SpO2: 99% 99% 98% 99%  Weight:      Height:  Intake/Output Summary (Last 24 hours) at 06/08/2020 1237 Last data filed at 06/07/2020 2357 Gross per 24 hour  Intake 1643.15 ml  Output --  Net 1643.15 ml   Filed Weights   06/06/20 1549  Weight: 121.5 kg    Examination:  General exam: Appears calm and comfortable  Respiratory system: Clear to auscultation. Respiratory effort normal. Cardiovascular system: S1 & S2 heard, RRR. No JVD, murmurs, rubs, gallops or clicks. No pedal edema. Gastrointestinal system: Abdomen is nondistended, soft and nontender. No  organomegaly or masses felt. Normal bowel sounds heard. Central nervous system: Alert and oriented. No focal neurological deficits. Extremities: Symmetric 5 x 5 power. Skin: No rashes, lesions or ulcers Psychiatry: Judgement and insight appear normal. Mood & affect appropriate.     Data Reviewed: I have personally reviewed following labs and imaging studies  CBC: Recent Labs  Lab 06/05/20 2026 06/07/20 0805  WBC 10.1 9.4  HGB 13.7 11.2*  HCT 37.0* 30.5*  MCV 80.6 82.0  PLT 205 165   Basic Metabolic Panel: Recent Labs  Lab 06/05/20 2026 06/06/20 0320 06/07/20 0805 06/08/20 0701  NA 137 136 135 136  K 3.4* 2.8* 2.9* 3.4*  CL 97* 100 102 105  CO2 30 28 23 26   GLUCOSE 114* 83 142* 108*  BUN 25* 28* 21* 13  CREATININE 2.63* 2.76* 2.07* 1.54*  CALCIUM 8.8* 7.7* 7.9* 7.9*  MG  --   --  1.8  --    GFR: Estimated Creatinine Clearance: 92.2 mL/min (A) (by C-G formula based on SCr of 1.54 mg/dL (H)). Liver Function Tests: Recent Labs  Lab 06/05/20 2026 06/07/20 0805  AST 42* 46*  ALT 39 35  ALKPHOS 88 81  BILITOT 1.4* 0.9  PROT 7.2 5.4*  ALBUMIN 3.8 2.8*   Recent Labs  Lab 06/05/20 2026  LIPASE 24   No results for input(s): AMMONIA in the last 168 hours. Coagulation Profile: No results for input(s): INR, PROTIME in the last 168 hours. Cardiac Enzymes: Recent Labs  Lab 06/06/20 0322  CKTOTAL 70   BNP (last 3 results) No results for input(s): PROBNP in the last 8760 hours. HbA1C: No results for input(s): HGBA1C in the last 72 hours. CBG: No results for input(s): GLUCAP in the last 168 hours. Lipid Profile: No results for input(s): CHOL, HDL, LDLCALC, TRIG, CHOLHDL, LDLDIRECT in the last 72 hours. Thyroid Function Tests: No results for input(s): TSH, T4TOTAL, FREET4, T3FREE, THYROIDAB in the last 72 hours. Anemia Panel: No results for input(s): VITAMINB12, FOLATE, FERRITIN, TIBC, IRON, RETICCTPCT in the last 72 hours. Sepsis Labs: Recent Labs  Lab  06/06/20 0502 06/07/20 0805 06/08/20 0701  PROCALCITON 1.67 0.88 0.61  LATICACIDVEN 0.8  --   --     Recent Results (from the past 240 hour(s))  SARS Coronavirus 2 by RT PCR (hospital order, performed in [facility] hospital lab) Nasopharyngeal Nasopharyngeal Swab     Status: None   Collection Time: 06/06/20  5:01 AM   Specimen: Nasopharyngeal Swab  Result Value Ref Range Status   SARS Coronavirus 2 NEGATIVE NEGATIVE Final    Comment: (NOTE) SARS-CoV-2 target nucleic acids are NOT DETECTED.  The SARS-CoV-2 RNA is generally detectable in upper and lower respiratory specimens during the acute phase of infection. The lowest concentration of SARS-CoV-2 viral copies this assay can detect is 250 copies / mL. A negative result does not preclude SARS-CoV-2 infection and should not be used as the sole basis for treatment or other patient management decisions.  A negative result may occur with improper specimen collection / handling, submission of specimen other than nasopharyngeal swab, presence of viral mutation(s) within the areas targeted by this assay, and inadequate number of viral copies (<250 copies / mL). A negative result must be combined with clinical observations, patient history, and epidemiological information.  Fact Sheet for Patients:   BoilerBrush.com.cy  Fact Sheet for Healthcare Providers: https://pope.com/  This test is not yet approved or  cleared by the [place] FDA and has been authorized for detection and/or diagnosis of SARS-CoV-2 by FDA under an Emergency Use Authorization (EUA).  This EUA will remain in effect (meaning this test can be used) for the duration of the COVID-19 declaration under Section 564(b)(1) of the Act, 21 U.S.C. section 360bbb-3(b)(1), unless the authorization is terminated or revoked sooner.  Performed at [facility], [address], [place], [place] [address]           Radiology Studies: No results found.      Scheduled Meds: . enoxaparin (LOVENOX) injection  40 mg Subcutaneous Q24H  . potassium chloride  40 mEq Oral BID   Continuous Infusions: . sodium chloride 100 mL/hr at 06/08/20 1143     LOS: 2 days    Time spent:    Delfino Lovett, MD [facility]   If 7PM-7AM, please contact night-coverage [email or web address]  06/08/2020, 12:37 PM

## 2020-06-09 LAB — BASIC METABOLIC PANEL
Anion gap: 5 (ref 5–15)
BUN: 9 mg/dL (ref 6–20)
CO2: 23 mmol/L (ref 22–32)
Calcium: 7.6 mg/dL — ABNORMAL LOW (ref 8.9–10.3)
Chloride: 108 mmol/L (ref 98–111)
Creatinine, Ser: 1.4 mg/dL — ABNORMAL HIGH (ref 0.61–1.24)
GFR calc Af Amer: >60 mL/min (ref >60)
GFR calc non Af Amer: >60 mL/min (ref >60)
Glucose, Bld: 95 mg/dL (ref 70–99)
Potassium: 3.9 mmol/L (ref 3.5–5.1)
Sodium: 136 mmol/L (ref 135–145)

## 2020-06-09 LAB — CBC
HCT: 31.3 % — ABNORMAL LOW (ref 39.0–52.0)
Hemoglobin: 11.1 g/dL — ABNORMAL LOW (ref 13.0–17.0)
MCH: 29.7 pg (ref 26.0–34.0)
MCHC: 35.5 g/dL (ref 30.0–36.0)
MCV: 83.7 fL (ref 80.0–100.0)
Platelets: 163 10*3/uL (ref 150–400)
RBC: 3.74 MIL/uL — ABNORMAL LOW (ref 4.22–5.81)
RDW: 13.8 % (ref 11.5–15.5)
WBC: 11.8 10*3/uL — ABNORMAL HIGH (ref 4.0–10.5)
nRBC: 0 % (ref 0.0–0.2)

## 2020-06-09 LAB — CHLAMYDIA/NGC RT PCR (ARMC ONLY)
Chlamydia Tr: NOT DETECTED
N gonorrhoeae: NOT DETECTED

## 2020-06-09 MED ORDER — AMLODIPINE BESYLATE 5 MG PO TABS
5.0000 mg | ORAL_TABLET | Freq: Every day | ORAL | Status: DC
  Administered 2020-06-10: 5 mg via ORAL
  Filled 2020-06-09: qty 1

## 2020-06-09 MED ORDER — SERTRALINE HCL 50 MG PO TABS
100.0000 mg | ORAL_TABLET | Freq: Every day | ORAL | Status: DC
  Administered 2020-06-09 – 2020-06-10 (×2): 100 mg via ORAL
  Filled 2020-06-09 (×2): qty 2

## 2020-06-09 MED ORDER — BUSPIRONE HCL 15 MG PO TABS
15.0000 mg | ORAL_TABLET | Freq: Every day | ORAL | Status: DC
  Administered 2020-06-09 – 2020-06-10 (×2): 15 mg via ORAL
  Filled 2020-06-09 (×2): qty 1

## 2020-06-09 NOTE — Progress Notes (Signed)
PROGRESS NOTE    Sean FreezeWilliam B Morrone  ZOX:[id number] DOB: 01/10/1986 DOA: 06/06/2020 PCP: Dione Housekeeperlmedo, Mario Ernesto, MD    Brief Narrative:  HPI: Sean Wolf is a 34 y.o. male with medical history significant for hypertension who presents to the emergency room with a 2-week history of nausea, vomiting and intermittent diarrhea, associated with generalized malaise, subjective fevers.  Denies abdominal pain.  Denies cough or shortness of breath. ED Course: On arrival in the emergency room he was hypotensive, with BP as low as 82/44, tachycardic at 115 and low-grade temperature of 99.2.  Blood work showed normal WBC and hemoglobin.  Lactic acid not done.  Creatinine elevated at 2.63.  Patient was hydrated with 3 L normal saline and repeat creatinine was 2.76.  CT abdomen and pelvis showed no acute findings.    Admitted for further evaluation and management.   Assessment & Plan:   Principal Problem:   Acute gastroenteritis Active Problems:   Essential hypertension   AKI (acute kidney injury) (HCC)   Hypotension   Hypokalemia   1. Acute gastroenteritis.  Patient was having several days of diarrhea and vomiting prior to admission.  He does not report any more diarrhea or vomiting but feels he has been febrile although none reported in last 24 hours is T-max was 99.0. Imaging including CT abdomen did not show any acute findings.  Stool studies are negative including C. difficile and GI panel.  Continue supportive treatment with antiemetics.  Tolerating Regular diet.  Will check H. Pylori, Chlamydia/gonorrhea titers 2. Acute kidney injury.  Baseline creatinine of 1.1 on 05/17/2020.  Creatinine elevated to 2.76 on admission.  Suspect is related to hypovolemia/dehydration. Patient was also is taking ARB/hydrochlorothiazide prior to admission which is on hold. Continue with IV fluids and monitor urine output. Renal function is improving, creatinine 1.4 today 3. Hypotension.  In the setting of dehydration.   Chronically on losartan/hydrochlorothiazide, amlodipine. hold in the setting of borderline blood pressures. 4. Hypokalemia -repleted 5. Antiviral therapy. 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 HIV screen was negative   DVT prophylaxis: enoxaparin (LOVENOX) injection 40 mg Start: 06/06/20 2200  Code Status: full code Family Communication: discussed with patient Disposition Plan: Status is: Inpatient  Remains inpatient appropriate because:Inpatient level of care appropriate due to severity of illness. Needs continues IV fluids for renal failure and electrolyte replacement   Dispo: The patient is from: Home              Anticipated d/c is to: Home              Anticipated d/c date is: 1 day              Patient currently is not medically stable to d/c.      Subjective: Patient does not seem happy today.  Complaints that the vitals are not taken appropriately as he still feels febrile and temperature is not reflecting accurately.  Does not feel he can go home like this  Objective: Vitals:   06/08/20 1155 06/08/20 1912 06/09/20 0402 06/09/20 0814  BP: 109/68 121/83 104/61 117/77  Pulse: 84 (!) 110 (!) 108 80  Resp: 16 15 16 18   Temp: 98.4 F (36.9 C) 98.8 F (37.1 C) 99 F (37.2 C) 98.5 F (36.9 C)  TempSrc: Oral Oral Oral Oral  SpO2: 99% 95% (!) 87% 100%  Weight:      Height:        Intake/Output Summary (Last 24 hours) at 06/09/2020  1352 Last data filed at 06/08/2020 2100 Gross per 24 hour  Intake --  Output 800 ml  Net -800 ml   Filed Weights   06/06/20 1549  Weight: 121.5 kg    Examination:  General exam: Appears calm and comfortable  Respiratory system: Clear to auscultation. Respiratory effort normal. Cardiovascular system: S1 & S2 heard, RRR. No JVD, murmurs, rubs, gallops or clicks. No pedal edema. Gastrointestinal system: Abdomen is nondistended, soft and nontender. No organomegaly or masses felt. Normal bowel sounds heard. Central nervous  system: Alert and oriented. No focal neurological deficits. Extremities: Symmetric 5 x 5 power. Skin: No rashes, lesions or ulcers Psychiatry: Judgement and insight appear normal. Mood & affect appropriate.     Data Reviewed: I have personally reviewed following labs and imaging studies  CBC: Recent Labs  Lab 06/05/20 2026 06/07/20 0805 06/09/20 0601  WBC 10.1 9.4 11.8*  HGB 13.7 11.2* 11.1*  HCT 37.0* 30.5* 31.3*  MCV 80.6 82.0 83.7  PLT 205 165 163   Basic Metabolic Panel: Recent Labs  Lab 06/05/20 2026 06/06/20 0320 06/07/20 0805 06/08/20 0701 06/09/20 0601  NA 137 136 135 136 136  K 3.4* 2.8* 2.9* 3.4* 3.9  CL 97* 100 102 105 108  CO2 30 28 23 26 23   GLUCOSE 114* 83 142* 108* 95  BUN 25* 28* 21* 13 9  CREATININE 2.63* 2.76* 2.07* 1.54* 1.40*  CALCIUM 8.8* 7.7* 7.9* 7.9* 7.6*  MG  --   --  1.8  --   --    GFR: Estimated Creatinine Clearance: 101.4 mL/min (A) (by C-G formula based on SCr of 1.4 mg/dL (H)). Liver Function Tests: Recent Labs  Lab 06/05/20 2026 06/07/20 0805  AST 42* 46*  ALT 39 35  ALKPHOS 88 81  BILITOT 1.4* 0.9  PROT 7.2 5.4*  ALBUMIN 3.8 2.8*   Recent Labs  Lab 06/05/20 2026  LIPASE 24   No results for input(s): AMMONIA in the last 168 hours. Coagulation Profile: No results for input(s): INR, PROTIME in the last 168 hours. Cardiac Enzymes: Recent Labs  Lab 06/06/20 0322  CKTOTAL 70   BNP (last 3 results) No results for input(s): PROBNP in the last 8760 hours. HbA1C: No results for input(s): HGBA1C in the last 72 hours. CBG: No results for input(s): GLUCAP in the last 168 hours. Lipid Profile: No results for input(s): CHOL, HDL, LDLCALC, TRIG, CHOLHDL, LDLDIRECT in the last 72 hours. Thyroid Function Tests: No results for input(s): TSH, T4TOTAL, FREET4, T3FREE, THYROIDAB in the last 72 hours. Anemia Panel: No results for input(s): VITAMINB12, FOLATE, FERRITIN, TIBC, IRON, RETICCTPCT in the last 72 hours. Sepsis  Labs: Recent Labs  Lab 06/06/20 0502 06/07/20 0805 06/08/20 0701  PROCALCITON 1.67 0.88 0.61  LATICACIDVEN 0.8  --   --     Recent Results (from the past 240 hour(s))  SARS Coronavirus 2 by RT PCR (hospital order, performed in [facility] hospital lab) Nasopharyngeal Nasopharyngeal Swab     Status: None   Collection Time: 06/06/20  5:01 AM   Specimen: Nasopharyngeal Swab  Result Value Ref Range Status   SARS Coronavirus 2 NEGATIVE NEGATIVE Final    Comment: (NOTE) SARS-CoV-2 target nucleic acids are NOT DETECTED.  The SARS-CoV-2 RNA is generally detectable in upper and lower respiratory specimens during the acute phase of infection. The lowest concentration of SARS-CoV-2 viral copies this assay can detect is 250 copies / mL. A negative result does not preclude SARS-CoV-2 infection and should not be  used as the sole basis for treatment or other patient management decisions.  A negative result may occur with improper specimen collection / handling, submission of specimen other than nasopharyngeal swab, presence of viral mutation(s) within the areas targeted by this assay, and inadequate number of viral copies (<250 copies / mL). A negative result must be combined with clinical observations, patient history, and epidemiological information.  Fact Sheet for Patients:   BoilerBrush.com.cy  Fact Sheet for Healthcare Providers: https://pope.com/  This test is not yet approved or  cleared by the [place] FDA and has been authorized for detection and/or diagnosis of SARS-CoV-2 by FDA under an Emergency Use Authorization (EUA).  This EUA will remain in effect (meaning this test can be used) for the duration of the COVID-19 declaration under Section 564(b)(1) of the Act, 21 U.S.C. section 360bbb-3(b)(1), unless the authorization is terminated or revoked sooner.  Performed at [facility], [address]  [place], [place] [address]   Gastrointestinal Panel by PCR , Stool     Status: None   Collection Time: 06/08/20  9:56 AM   Specimen: Stool  Result Value Ref Range Status   Campylobacter species NOT DETECTED NOT DETECTED Final   Plesimonas shigelloides NOT DETECTED NOT DETECTED Final   Salmonella species NOT DETECTED NOT DETECTED Final   Yersinia enterocolitica NOT DETECTED NOT DETECTED Final   Vibrio species NOT DETECTED NOT DETECTED Final   Vibrio cholerae NOT DETECTED NOT DETECTED Final   Enteroaggregative E coli (EAEC) NOT DETECTED NOT DETECTED Final   Enteropathogenic E coli (EPEC) NOT DETECTED NOT DETECTED Final   Enterotoxigenic E coli (ETEC) NOT DETECTED NOT DETECTED Final   Shiga like toxin producing E coli (STEC) NOT DETECTED NOT DETECTED Final   Shigella/Enteroinvasive E coli (EIEC) NOT DETECTED NOT DETECTED Final   Cryptosporidium NOT DETECTED NOT DETECTED Final   Cyclospora cayetanensis NOT DETECTED NOT DETECTED Final   Entamoeba histolytica NOT DETECTED NOT DETECTED Final   Giardia lamblia NOT DETECTED NOT DETECTED Final   Adenovirus F40/41 NOT DETECTED NOT DETECTED Final   Astrovirus NOT DETECTED NOT DETECTED Final   Norovirus GI/GII NOT DETECTED NOT DETECTED Final   Rotavirus A NOT DETECTED NOT DETECTED Final   Sapovirus (I, II, IV, and V) NOT DETECTED NOT DETECTED Final    Comment: Performed at [facility], [address] [place], [place] [address]  C Difficile Quick Screen w PCR reflex     Status: None   Collection Time: 06/08/20  9:56 AM   Specimen: STOOL  Result Value Ref Range Status   C Diff antigen NEGATIVE NEGATIVE Final   C Diff toxin NEGATIVE NEGATIVE Final   C Diff interpretation No C. difficile detected.  Final    Comment: Performed at [facility], [address] [place], [place] [address]  Chlamydia/NGC rt PCR [facility] only)     Status: None   Collection Time: 06/09/20 10:58 AM   Specimen: Urine  Result Value Ref Range Status    Specimen source GC/Chlam URINE, RANDOM  Final   Chlamydia Tr NOT DETECTED NOT DETECTED Final   N gonorrhoeae NOT DETECTED NOT DETECTED Final    Comment: (NOTE) This CT/NG assay has not been evaluated in patients with a history of  hysterectomy. Performed at [facility], [address], [place], [place] [address]          Radiology Studies: No results found.      Scheduled Meds: . amLODipine  5 mg Oral  Daily  . busPIRone  15 mg Oral Daily  . enoxaparin (LOVENOX) injection  40 mg Subcutaneous Q24H  . potassium chloride  40 mEq Oral BID  . sertraline  100 mg Oral Daily   Continuous Infusions: . sodium chloride 100 mL/hr at 06/09/20 0449     LOS: 3 days    Time spent:    Delfino Lovett, MD [facility]   If 7PM-7AM, please contact night-coverage [email or web address]  06/09/2020, 1:52 PM

## 2020-06-10 LAB — BASIC METABOLIC PANEL
Anion gap: 3 — ABNORMAL LOW (ref 5–15)
BUN: 8 mg/dL (ref 6–20)
CO2: 24 mmol/L (ref 22–32)
Calcium: 7.4 mg/dL — ABNORMAL LOW (ref 8.9–10.3)
Chloride: 110 mmol/L (ref 98–111)
Creatinine, Ser: 1.18 mg/dL (ref 0.61–1.24)
GFR calc Af Amer: >60 mL/min (ref >60)
GFR calc non Af Amer: >60 mL/min (ref >60)
Glucose, Bld: 90 mg/dL (ref 70–99)
Potassium: 4.1 mmol/L (ref 3.5–5.1)
Sodium: 137 mmol/L (ref 135–145)

## 2020-06-10 LAB — CBC
HCT: 31.3 % — ABNORMAL LOW (ref 39.0–52.0)
Hemoglobin: 10.8 g/dL — ABNORMAL LOW (ref 13.0–17.0)
MCH: 29.3 pg (ref 26.0–34.0)
MCHC: 34.5 g/dL (ref 30.0–36.0)
MCV: 85.1 fL (ref 80.0–100.0)
Platelets: 164 10*3/uL (ref 150–400)
RBC: 3.68 MIL/uL — ABNORMAL LOW (ref 4.22–5.81)
RDW: 14.1 % (ref 11.5–15.5)
WBC: 12.6 10*3/uL — ABNORMAL HIGH (ref 4.0–10.5)
nRBC: 0 % (ref 0.0–0.2)

## 2020-06-10 MED ORDER — ONDANSETRON HCL 4 MG PO TABS: 4.0000 mg | ORAL_TABLET | Freq: Four times a day (QID) | ORAL | 0 refills | Status: DC | PRN

## 2020-06-10 NOTE — Progress Notes (Signed)
Pt. Had episode of emesis. Complaining of nausea. PRNs given.

## 2020-06-10 NOTE — Discharge Summary (Signed)
Physician Discharge Summary  Sean Wolf ZOX:[id number] DOB: [birth year] DOA: 06/06/2020  PCP: Dione Housekeeper, MD  Admit date: 06/06/2020 Discharge date: 06/10/2020  Admitted From: Home Disposition:  Home  Recommendations for Outpatient Follow-up:  1. Follow up with PCP in 1-2 weeks 2. Please obtain BMP/CBC in one week 3. Please follow up on the following pending results: H. pylori labs  Home Health: No Equipment/Devices: None Discharge Condition: Stable CODE STATUS: Full Diet recommendation: Heart Healthy   Brief/Interim Summary: Sean Wolf a 33 y.o.malewith medical history significant forhypertension who presents to the emergency room with a 2-week history of nausea, vomiting and intermittent diarrhea, associated with generalized malaise, subjective fevers. Denies abdominal pain. Denies cough or shortness of breath. On arrival he was hypotensive and tachycardic with normal WBC, afebrile. Hypotension improved with IV hydration.  He was also having AKI most likely secondary to dehydration with GI losses which improved with IV fluid.  CT abdomen and pelvis without any acute abnormalities.  C. difficile colitis and GI pathogen's were negative.  Diarrhea resolved.  Patient continued to have decreased appetite, occasionally feels nauseated.  H. pylori labs were done which was pending on discharge.  We also check chlamydia/gonorrhea titers which was negative.  Patient was on a combination pill of losartan and HCTZ prior to admission which was held due to hypotension and AKI.  As his blood pressure improved we restarted home dose of amlodipine while in the hospital.  We advised him to keep holding losartan and HCTZ for next few days and follow-up with his primary care physician before restarting them to ensure a proper blood pressure.  His creatinine was 1.18 on the day of discharge. He was also provided for a referral to see an gastroenterologist as an outpatient for  continuation of dyspepsia.  Patient did had some hypokalemia on presentation, most likely secondary to GI losses and being on HCTZ.  Resolved before discharge.  Patient needs to follow-up with his primary care physician within a week for further management and will continue rest of his home medications.  Discharge Diagnoses:  Principal Problem:   Acute gastroenteritis Active Problems:   Essential hypertension   AKI (acute kidney injury) (HCC)   Hypotension   Hypokalemia   Discharge Instructions  Discharge Instructions    Ambulatory referral to Gastroenterology   Complete by: As directed    Patient with persistent dyspepsia, H. pylori labs are pending.   Diet - low sodium heart healthy   Complete by: As directed    Discharge instructions   Complete by: As directed    It was pleasure taking care of you. Please hold your losartan and HCTZ for next few days and follow-up with your primary care physician before restarting them. You are also being provided with a referral to see a gastroenterologist. Some of your labs for H. pylori which is a bacteria in your stomach can cause these type of symptoms are still pending, we will call you with any abnormal results. Keep yourself well-hydrated.   Increase activity slowly   Complete by: As directed      Allergies as of 06/10/2020   No Known Allergies     Medication List    TAKE these medications   amLODipine 5 MG tablet Commonly known as: NORVASC Take 5 mg by mouth daily.   busPIRone 15 MG tablet Commonly known as: BUSPAR Take 15 mg by mouth daily.   cyanocobalamin 1000 MCG/ML injection Commonly known as: (VITAMIN B-12) Inject 1,000 mcg  into the muscle every 30 (thirty) days.   emtricitabine-tenofovir 200-300 MG tablet Commonly known as: TRUVADA Take 1 tablet by mouth daily.   Klor-Con M20 20 MEQ tablet Generic drug: potassium chloride SA Take 20 mEq by mouth daily.   losartan-hydrochlorothiazide 100-25 MG  tablet Commonly known as: HYZAAR Take 1 tablet by mouth daily.   ondansetron 4 MG tablet Commonly known as: ZOFRAN Take 1 tablet (4 mg total) by mouth every 6 (six) hours as needed for nausea.   sertraline 100 MG tablet Commonly known as: ZOLOFT Take 100 mg by mouth daily.   Sunosi 150 MG Tabs Generic drug: Solriamfetol HCl Take 150 mg by mouth daily. Take half a pill once daily for the first week, then take 1 pill once daily thereafter What changed: additional instructions   VanishPoint Safety Syringe 25G X 1" 3 ML Misc Generic drug: SYRINGE-NEEDLE (DISP) 3 ML See admin instructions.       Follow-up Information    Olmedo, Joycie Peek, MD. Schedule an appointment as soon as possible for a visit.   Specialty: Family Medicine Contact information: [address] [place] [address] [phone]              No Known Allergies  Consultations:  None  Procedures/Studies: CT ABDOMEN PELVIS WO CONTRAST  Result Date: 06/06/2020 CLINICAL DATA:  Acute abdominal pain with nausea and vomiting EXAM: CT ABDOMEN AND PELVIS WITHOUT CONTRAST TECHNIQUE: Multidetector CT imaging of the abdomen and pelvis was performed following the standard protocol without IV contrast. COMPARISON:  None FINDINGS: Lower chest: No acute abnormality. Hepatobiliary: No focal liver abnormality is seen. No gallstones, gallbladder wall thickening, or biliary dilatation. Pancreas: Unremarkable. No pancreatic ductal dilatation or surrounding inflammatory changes. Spleen: Spleen is within normal limits. Adrenals/Urinary Tract: Adrenal glands are within normal limits bilaterally. Kidneys demonstrate no renal calculi or urinary tract obstructive changes. The ureters are within normal limits. Bladder is partially distended. Stomach/Bowel: Appendix is within normal limits. No obstructive or inflammatory changes of the colon are seen. Small bowel is within normal limits. Stomach is unremarkable. Vascular/Lymphatic:  No significant vascular findings are present. No enlarged abdominal or pelvic lymph nodes. Circumaortic left renal vein is noted. Reproductive: Prostate is unremarkable. Other: No abdominal wall hernia or abnormality. No abdominopelvic ascites. Musculoskeletal: No acute or significant osseous findings. IMPRESSION: No acute abnormality noted. Electronically Signed   By: Alcide Clever M.D.   On: 06/06/2020 02:41     Subjective: Patient continued to feel decreased in his appetite, occasionally feels nauseated at the site of food.  No more vomiting or diarrhea.  Had a normal BM this morning.  Discussed about being discharged and follow-up with GI as an outpatient if he continued to experience dyspepsia.  Discharge Exam: Vitals:   06/10/20 0740 06/10/20 1104  BP: 124/83   Pulse: (!) 102   Resp: 18   Temp: 98.2 F (36.8 C) 98.7 F (37.1 C)  SpO2: 95%    Vitals:   06/09/20 2358 06/10/20 0425 06/10/20 0740 06/10/20 1104  BP: 116/83 131/82 124/83   Pulse: 100 90 (!) 102   Resp: 16 15 18    Temp: 99.4 F (37.4 C) 98.2 F (36.8 C) 98.2 F (36.8 C) 98.7 F (37.1 C)  TempSrc: Oral Oral Oral Oral  SpO2: 99% 100% 95%   Weight:      Height:        General: Pt is alert, awake, not in acute distress Cardiovascular: RRR, S1/S2 +, no rubs, no  gallops Respiratory: CTA bilaterally, no wheezing, no rhonchi Abdominal: Soft, NT, ND, bowel sounds + Extremities: no edema, no cyanosis   The results of significant diagnostics from this hospitalization (including imaging, microbiology, ancillary and laboratory) are listed below for reference.    Microbiology: Recent Results (from the past 240 hour(s))  SARS Coronavirus 2 by RT PCR (hospital order, performed in [facility] hospital lab) Nasopharyngeal Nasopharyngeal Swab     Status: None   Collection Time: 06/06/20  5:01 AM   Specimen: Nasopharyngeal Swab  Result Value Ref Range Status   SARS Coronavirus 2 NEGATIVE NEGATIVE Final    Comment:  (NOTE) SARS-CoV-2 target nucleic acids are NOT DETECTED.  The SARS-CoV-2 RNA is generally detectable in upper and lower respiratory specimens during the acute phase of infection. The lowest concentration of SARS-CoV-2 viral copies this assay can detect is 250 copies / mL. A negative result does not preclude SARS-CoV-2 infection and should not be used as the sole basis for treatment or other patient management decisions.  A negative result may occur with improper specimen collection / handling, submission of specimen other than nasopharyngeal swab, presence of viral mutation(s) within the areas targeted by this assay, and inadequate number of viral copies (<250 copies / mL). A negative result must be combined with clinical observations, patient history, and epidemiological information.  Fact Sheet for Patients:   BoilerBrush.com.cyhttps://www.fda.gov/media/136312/download  Fact Sheet for Healthcare Providers: https://pope.com/https://www.fda.gov/media/136313/download  This test is not yet approved or  cleared by the [place] FDA and has been authorized for detection and/or diagnosis of SARS-CoV-2 by FDA under an Emergency Use Authorization (EUA).  This EUA will remain in effect (meaning this test can be used) for the duration of the COVID-19 declaration under Section 564(b)(1) of the Act, 21 U.S.C. section 360bbb-3(b)(1), unless the authorization is terminated or revoked sooner.  Performed at [facility], [address] [place], [place] [address]   Gastrointestinal Panel by PCR , Stool     Status: None   Collection Time: 06/08/20  9:56 AM   Specimen: Stool  Result Value Ref Range Status   Campylobacter species NOT DETECTED NOT DETECTED Final   Plesimonas shigelloides NOT DETECTED NOT DETECTED Final   Salmonella species NOT DETECTED NOT DETECTED Final   Yersinia enterocolitica NOT DETECTED NOT DETECTED Final   Vibrio species NOT DETECTED NOT DETECTED Final   Vibrio cholerae NOT DETECTED NOT  DETECTED Final   Enteroaggregative E coli (EAEC) NOT DETECTED NOT DETECTED Final   Enteropathogenic E coli (EPEC) NOT DETECTED NOT DETECTED Final   Enterotoxigenic E coli (ETEC) NOT DETECTED NOT DETECTED Final   Shiga like toxin producing E coli (STEC) NOT DETECTED NOT DETECTED Final   Shigella/Enteroinvasive E coli (EIEC) NOT DETECTED NOT DETECTED Final   Cryptosporidium NOT DETECTED NOT DETECTED Final   Cyclospora cayetanensis NOT DETECTED NOT DETECTED Final   Entamoeba histolytica NOT DETECTED NOT DETECTED Final   Giardia lamblia NOT DETECTED NOT DETECTED Final   Adenovirus F40/41 NOT DETECTED NOT DETECTED Final   Astrovirus NOT DETECTED NOT DETECTED Final   Norovirus GI/GII NOT DETECTED NOT DETECTED Final   Rotavirus A NOT DETECTED NOT DETECTED Final   Sapovirus (I, II, IV, and V) NOT DETECTED NOT DETECTED Final    Comment: Performed at [facility], [address] [place], [place] [address]  C Difficile Quick Screen w PCR reflex     Status: None   Collection Time: 06/08/20  9:56 AM   Specimen: STOOL  Result  Value Ref Range Status   C Diff antigen NEGATIVE NEGATIVE Final   C Diff toxin NEGATIVE NEGATIVE Final   C Diff interpretation No C. difficile detected.  Final    Comment: Performed at [facility], [address] [place], [place] [address]  CULTURE, BLOOD (ROUTINE X 2) w Reflex to ID Panel     Status: None (Preliminary result)   Collection Time: 06/09/20  9:23 AM   Specimen: BLOOD  Result Value Ref Range Status   Specimen Description BLOOD RIGHT ANTECUBITAL  Final   Special Requests BOTTLES DRAWN AEROBIC AND ANAEROBIC  Final   Culture   Final    NO GROWTH < 24 HOURS Performed at [facility], [address] [place], [place] [address]    Report Status PENDING  Incomplete  CULTURE, BLOOD (ROUTINE X 2) w Reflex to ID Panel     Status: None (Preliminary result)   Collection Time: 06/09/20  9:23 AM   Specimen: BLOOD  Result Value Ref Range  Status   Specimen Description BLOOD LEFT ANTECUBITAL  Final   Special Requests   Final    BOTTLES DRAWN AEROBIC AND ANAEROBIC Blood Culture adequate volume   Culture   Final    NO GROWTH < 24 HOURS Performed at [facility], [address] [place], [place] [address]    Report Status PENDING  Incomplete  Chlamydia/NGC rt PCR ([facility] only)     Status: None   Collection Time: 06/09/20 10:58 AM   Specimen: Urine  Result Value Ref Range Status   Specimen source GC/Chlam URINE, RANDOM  Final   Chlamydia Tr NOT DETECTED NOT DETECTED Final   N gonorrhoeae NOT DETECTED NOT DETECTED Final    Comment: (NOTE) This CT/NG assay has not been evaluated in patients with a history of  hysterectomy. Performed at [facility], [address] [place], [place] [address]      Labs: BNP (last 3 results) No results for input(s): BNP in the last 8760 hours. Basic Metabolic Panel: Recent Labs  Lab 06/06/20 0320 06/07/20 0805 06/08/20 0701 06/09/20 0601 06/10/20 0410  NA 136 135 136 136 137  K 2.8* 2.9* 3.4* 3.9 4.1  CL 100 102 105 108 110  CO2 28 23 26 23 24   GLUCOSE 83 142* 108* 95 90  BUN 28* 21* 13 9 8   CREATININE 2.76* 2.07* 1.54* 1.40* 1.18  CALCIUM 7.7* 7.9* 7.9* 7.6* 7.4*  MG  --  1.8  --   --   --    Liver Function Tests: Recent Labs  Lab 06/05/20 2026 06/07/20 0805  AST 42* 46*  ALT 39 35  ALKPHOS 88 81  BILITOT 1.4* 0.9  PROT 7.2 5.4*  ALBUMIN 3.8 2.8*   Recent Labs  Lab 06/05/20 2026  LIPASE 24   No results for input(s): AMMONIA in the last 168 hours. CBC: Recent Labs  Lab 06/05/20 2026 06/07/20 0805 06/09/20 0601 06/10/20 0410  WBC 10.1 9.4 11.8* 12.6*  HGB 13.7 11.2* 11.1* 10.8*  HCT 37.0* 30.5* 31.3* 31.3*  MCV 80.6 82.0 83.7 85.1  PLT 205 165 163 164   Cardiac Enzymes: Recent Labs  Lab 06/06/20 0322  CKTOTAL 70   BNP: Invalid input(s): POCBNP CBG: No results for input(s): GLUCAP in the last 168 hours. D-Dimer No results  for input(s): DDIMER in the last 72 hours. Hgb A1c No results for input(s): HGBA1C in the last 72 hours. Lipid Profile No results for input(s): CHOL, HDL, LDLCALC,  TRIG, CHOLHDL, LDLDIRECT in the last 72 hours. Thyroid function studies No results for input(s): TSH, T4TOTAL, T3FREE, THYROIDAB in the last 72 hours.  Invalid input(s): FREET3 Anemia work up No results for input(s): VITAMINB12, FOLATE, FERRITIN, TIBC, IRON, RETICCTPCT in the last 72 hours. Urinalysis    Component Value Date/Time   COLORURINE AMBER (A) 06/05/2020 2026   APPEARANCEUR CLOUDY (A) 06/05/2020 2026   LABSPEC 1.016 06/05/2020 2026   PHURINE 5.0 06/05/2020 2026   GLUCOSEU NEGATIVE 06/05/2020 2026   HGBUR NEGATIVE 06/05/2020 2026   BILIRUBINUR NEGATIVE 06/05/2020 2026   KETONESUR NEGATIVE 06/05/2020 2026   PROTEINUR 30 (A) 06/05/2020 2026   NITRITE NEGATIVE 06/05/2020 2026   LEUKOCYTESUR NEGATIVE 06/05/2020 2026   Sepsis Labs Invalid input(s): PROCALCITONIN,  WBC,  LACTICIDVEN Microbiology Recent Results (from the past 240 hour(s))  SARS Coronavirus 2 by RT PCR (hospital order, performed in [facility] hospital lab) Nasopharyngeal Nasopharyngeal Swab     Status: None   Collection Time: 06/06/20  5:01 AM   Specimen: Nasopharyngeal Swab  Result Value Ref Range Status   SARS Coronavirus 2 NEGATIVE NEGATIVE Final    Comment: (NOTE) SARS-CoV-2 target nucleic acids are NOT DETECTED.  The SARS-CoV-2 RNA is generally detectable in upper and lower respiratory specimens during the acute phase of infection. The lowest concentration of SARS-CoV-2 viral copies this assay can detect is 250 copies / mL. A negative result does not preclude SARS-CoV-2 infection and should not be used as the sole basis for treatment or other patient management decisions.  A negative result may occur with improper specimen collection / handling, submission of specimen other than nasopharyngeal swab, presence of viral mutation(s) within  the areas targeted by this assay, and inadequate number of viral copies (<250 copies / mL). A negative result must be combined with clinical observations, patient history, and epidemiological information.  Fact Sheet for Patients:   BoilerBrush.com.cy  Fact Sheet for Healthcare Providers: https://pope.com/  This test is not yet approved or  cleared by the [place] FDA and has been authorized for detection and/or diagnosis of SARS-CoV-2 by FDA under an Emergency Use Authorization (EUA).  This EUA will remain in effect (meaning this test can be used) for the duration of the COVID-19 declaration under Section 564(b)(1) of the Act, 21 U.S.C. section 360bbb-3(b)(1), unless the authorization is terminated or revoked sooner.  Performed at [facility], [address] [place], [place] [address]   Gastrointestinal Panel by PCR , Stool     Status: None   Collection Time: 06/08/20  9:56 AM   Specimen: Stool  Result Value Ref Range Status   Campylobacter species NOT DETECTED NOT DETECTED Final   Plesimonas shigelloides NOT DETECTED NOT DETECTED Final   Salmonella species NOT DETECTED NOT DETECTED Final   Yersinia enterocolitica NOT DETECTED NOT DETECTED Final   Vibrio species NOT DETECTED NOT DETECTED Final   Vibrio cholerae NOT DETECTED NOT DETECTED Final   Enteroaggregative E coli (EAEC) NOT DETECTED NOT DETECTED Final   Enteropathogenic E coli (EPEC) NOT DETECTED NOT DETECTED Final   Enterotoxigenic E coli (ETEC) NOT DETECTED NOT DETECTED Final   Shiga like toxin producing E coli (STEC) NOT DETECTED NOT DETECTED Final   Shigella/Enteroinvasive E coli (EIEC) NOT DETECTED NOT DETECTED Final   Cryptosporidium NOT DETECTED NOT DETECTED Final   Cyclospora cayetanensis NOT DETECTED NOT DETECTED Final   Entamoeba histolytica NOT DETECTED NOT DETECTED Final   Giardia lamblia NOT DETECTED NOT DETECTED Final   Adenovirus F40/41  NOT  DETECTED NOT DETECTED Final   Astrovirus NOT DETECTED NOT DETECTED Final   Norovirus GI/GII NOT DETECTED NOT DETECTED Final   Rotavirus A NOT DETECTED NOT DETECTED Final   Sapovirus (I, II, IV, and V) NOT DETECTED NOT DETECTED Final    Comment: Performed at [facility], [address] [place], [place] [address]  C Difficile Quick Screen w PCR reflex     Status: None   Collection Time: 06/08/20  9:56 AM   Specimen: STOOL  Result Value Ref Range Status   C Diff antigen NEGATIVE NEGATIVE Final   C Diff toxin NEGATIVE NEGATIVE Final   C Diff interpretation No C. difficile detected.  Final    Comment: Performed at [facility], [address] [place], [place] [address]  CULTURE, BLOOD (ROUTINE X 2) w Reflex to ID Panel     Status: None (Preliminary result)   Collection Time: 06/09/20  9:23 AM   Specimen: BLOOD  Result Value Ref Range Status   Specimen Description BLOOD RIGHT ANTECUBITAL  Final   Special Requests BOTTLES DRAWN AEROBIC AND ANAEROBIC  Final   Culture   Final    NO GROWTH < 24 HOURS Performed at [facility], [address] [place], [place] [address]    Report Status PENDING  Incomplete  CULTURE, BLOOD (ROUTINE X 2) w Reflex to ID Panel     Status: None (Preliminary result)   Collection Time: 06/09/20  9:23 AM   Specimen: BLOOD  Result Value Ref Range Status   Specimen Description BLOOD LEFT ANTECUBITAL  Final   Special Requests   Final    BOTTLES DRAWN AEROBIC AND ANAEROBIC Blood Culture adequate volume   Culture   Final    NO GROWTH < 24 HOURS Performed at [facility], [address] [place], [place] [address]    Report Status PENDING  Incomplete  Chlamydia/NGC rt PCR ([facility] only)     Status: None   Collection Time: 06/09/20 10:58 AM   Specimen: Urine  Result Value Ref Range Status   Specimen source GC/Chlam URINE, RANDOM  Final   Chlamydia Tr NOT DETECTED NOT DETECTED Final   N gonorrhoeae NOT DETECTED NOT DETECTED  Final    Comment: (NOTE) This CT/NG assay has not been evaluated in patients with a history of  hysterectomy. Performed at [facility], [address] [place], [place] [address]     Time coordinating discharge: Over 30 minutes  SIGNED:  Arnetha Courser, MD  [facility] 06/10/2020, 1:39 PM  If 7PM-7AM, please contact night-coverage [email or web address]  This record has been created using Conservation officer, historic buildings. Errors have been sought and corrected,but may not always be located. Such creation errors do not reflect on the standard of care.

## 2020-06-11 ENCOUNTER — Other Ambulatory Visit: Payer: Self-pay

## 2020-06-11 ENCOUNTER — Ambulatory Visit (INDEPENDENT_AMBULATORY_CARE_PROVIDER_SITE_OTHER): Payer: BC Managed Care – PPO | Admitting: Gastroenterology

## 2020-06-11 ENCOUNTER — Encounter: Payer: Self-pay | Admitting: Gastroenterology

## 2020-06-11 VITALS — BP 132/77 | HR 62 | Temp 97.4°F | Ht 71.0 in | Wt 281.0 lb

## 2020-06-11 DIAGNOSIS — R112 Nausea with vomiting, unspecified: Secondary | ICD-10-CM | POA: Diagnosis not present

## 2020-06-11 DIAGNOSIS — R197 Diarrhea, unspecified: Secondary | ICD-10-CM

## 2020-06-11 LAB — H PYLORI, IGM, IGG, IGA AB
H Pylori IgG: 0.78 Index Value (ref 0.00–0.79)
H. Pylogi, Iga Abs: <9 units (ref 0.0–8.9)
H. Pylogi, Igm Abs: 11.5 units — ABNORMAL HIGH (ref 0.0–8.9)

## 2020-06-11 MED ORDER — LOPERAMIDE HCL 2 MG PO TABS
2.0000 mg | ORAL_TABLET | Freq: Four times a day (QID) | ORAL | 0 refills | Status: DC | PRN
Start: 2020-06-11 — End: 2021-07-28

## 2020-06-11 MED ORDER — OMEPRAZOLE 20 MG PO CPDR
20.0000 mg | DELAYED_RELEASE_CAPSULE | Freq: Every day | ORAL | 0 refills | Status: DC
Start: 2020-06-11 — End: 2020-06-17

## 2020-06-11 NOTE — Patient Instructions (Signed)
Please follow up with your PCP in 1-2 weeks to discuss hypokalemia, repeat labs and nephrology referral if needed

## 2020-06-11 NOTE — Progress Notes (Signed)
Sean Wolf [address]  [address]  [place], [place] [address]  Main: [phone]  Fax: [phone]   Gastroenterology Consultation  Referring Provider:     Dione Housekeeper, * Primary Care Physician:  Dione Housekeeper, MD Reason for Consultation:    Diarrhea, nausea vomiting        HPI:    Chief Complaint  Patient presents with  . New Patient (Initial Visit)  . Hospitalization Follow-up    Sean Wolf is a 34 y.o. y/o male referred for consultation & management  by Dr. Zada Finders, Joycie Peek, MD.  Patient was recently admitted to the hospital with hypokalemia, nausea vomiting diarrhea, and discharged yesterday after resolution of symptoms.  GI panel and C. difficile testing was negative.  Patient described onset of symptoms, 1 to 2 weeks prior to the hospitalization.  No prior history of similar symptoms.  States while he was in the hospital he was constipated and did not have a bowel movement in 4 to 5 days there.  After discharge he has had recurrence of diarrhea with 6 loose bowel movements a day.  No blood in stool.  Reports family history of colon cancer in mother when she was about 57 years old.  He had his first colonoscopy in June 2017 that was normal.  He is chronically hypokalemic, due to unknown etiology.  Past Medical History:  Diagnosis Date  . Hypertension     No past surgical history on file.  Prior to Admission medications   Medication Sig Start Date End Date Taking? Authorizing Provider  amLODipine (NORVASC) 5 MG tablet Take 5 mg by mouth daily.   Yes [provider]  busPIRone (BUSPAR) 15 MG tablet Take 15 mg by mouth daily.  05/31/20  Yes [provider]  cyanocobalamin (,VITAMIN B-12,) 1000 MCG/ML injection Inject 1,000 mcg into the muscle every 30 (thirty) days.  05/13/20  Yes [provider]  emtricitabine-tenofovir (TRUVADA) 200-300 MG tablet Take 1 tablet by mouth daily.  04/18/20 07/17/20 Yes [provider]  KLOR-CON M20 20 MEQ tablet Take 20 mEq by mouth daily.  05/03/20  Yes [provider]  ondansetron (ZOFRAN) 4 MG tablet Take 1 tablet (4 mg total) by mouth every 6 (six) hours as needed for nausea. 06/10/20  Yes Arnetha Courser, MD  sertraline (ZOLOFT) 100 MG tablet Take 100 mg by mouth daily.   Yes [provider]  Solriamfetol HCl (SUNOSI) 150 MG TABS Take 150 mg by mouth daily. Take half a pill once daily for the first week, then take 1 pill once daily thereafter Patient taking differently: Take 150 mg by mouth daily.  02/01/20  Yes Lomax, Amy, NP  [employer] S[employer]Y SYRINGE 25G X 1" 3 ML MISC See admin instructions.  05/13/20  Yes [provider]  loperamide (IMODIUM A-D) 2 MG tablet Take 1 tablet (2 mg total) by mouth 4 (four) times daily as needed for diarrhea or loose stools. 06/11/20   Pasty Spillers, MD  losartan-hydrochlorothiazide (HYZAAR) 100-25 MG tablet Take 1 tablet by mouth daily. Patient not taking: Reported on 06/11/2020 05/16/20   [provider]  omeprazole (PRILOSEC) 20 MG capsule Take 1 capsule (20 mg total) by mouth daily. 06/11/20 07/11/20  Pasty Spillers, MD    Family History  Problem Relation Age of Onset  . Cancer Mother 65       colon  . Alcohol abuse Father   . Diabetes Father   . Drug  abuse Brother   . Stroke Brother        from vegetation due to iv drug use  . Cancer Maternal Grandmother        lymphoma  . Cancer Paternal Grandmother        breast  . Stroke Paternal Grandmother   . Early death Neg Hx   . Hyperlipidemia Neg Hx   . Heart disease Neg Hx      Social History   Tobacco Use  . Smoking status: Never Smoker  . Smokeless tobacco: Never Used  Vaping Use  . Vaping Use: Never used  Substance Use Topics  . Alcohol use: No  . Drug use: No    Allergies as of 06/11/2020  . (No Known Allergies)    Review of Systems:    All systems reviewed and negative except where noted in HPI.    Physical Exam:  BP 132/77   Pulse 62   Temp (!) 97.4 F (36.3 C) (Oral)   Ht 5\' 11"  (1.803 m)   Wt 281 lb (127.5 kg)   BMI 39.19 kg/m  No LMP for male patient. Psych:  Alert and cooperative. Normal mood and affect. General:   Alert,  Well-developed, well-nourished, pleasant and cooperative in NAD Head:  Normocephalic and atraumatic. Eyes:  Sclera clear, no icterus.   Conjunctiva pink. Ears:  Normal auditory acuity. Nose:  No deformity, discharge, or lesions. Mouth:  No deformity or lesions,oropharynx pink & moist. Neck:  Supple; no masses or thyromegaly. Abdomen:  Normal bowel sounds.  No bruits.  Soft, non-tender and non-distended without masses, hepatosplenomegaly or hernias noted.  No guarding or rebound tenderness.    Msk:  Symmetrical without gross deformities. Good, equal movement & strength bilaterally. Pulses:  Normal pulses noted. Extremities:  No clubbing or edema.  No cyanosis. Neurologic:  Alert and oriented x3;  grossly normal neurologically. Skin:  Intact without significant lesions or rashes. No jaundice. Lymph Nodes:  No significant cervical adenopathy. Psych:  Alert and cooperative. Normal mood and affect.   Labs: CBC    Component Value Date/Time   WBC 12.6 (H) 06/10/2020 0410   RBC 3.68 (L) 06/10/2020 0410   HGB 10.8 (L) 06/10/2020 0410   HGB 14.2 11/10/2017 0834   HCT 31.3 (L) 06/10/2020 0410   HCT 40.6 11/10/2017 0834   PLT 164 06/10/2020 0410   PLT 270 11/10/2017 0834   MCV 85.1 06/10/2020 0410   MCV 85 11/10/2017 0834   MCH 29.3 06/10/2020 0410   MCHC 34.5 06/10/2020 0410   RDW 14.1 06/10/2020 0410   RDW 14.0 11/10/2017 0834   LYMPHSABS 2.5 11/10/2017 0834   EOSABS 0.2 11/10/2017 0834   BASOSABS 0.0 11/10/2017 0834   CMP     Component Value Date/Time   NA 137 06/10/2020 0410   NA 144 11/10/2017 0834   K 4.1 06/10/2020 0410   CL 110 06/10/2020 0410   CO2 24 06/10/2020 0410   GLUCOSE 90 06/10/2020 0410   BUN 8 06/10/2020 0410   BUN 10  11/10/2017 0834   CREATININE 1.18 06/10/2020 0410   CALCIUM 7.4 (L) 06/10/2020 0410   PROT 5.4 (L) 06/07/2020 0805   PROT 6.6 11/10/2017 0834   ALBUMIN 2.8 (L) 06/07/2020 0805   ALBUMIN 4.5 11/10/2017 0834   AST 46 (H) 06/07/2020 0805   ALT 35 06/07/2020 0805   ALKPHOS 81 06/07/2020 0805   BILITOT 0.9 06/07/2020 0805   BILITOT 0.3 11/10/2017 0834   GFRNONAA >60 06/10/2020 0410  GFRAA >60 06/10/2020 0410    Imaging Studies: CT ABDOMEN PELVIS WO CONTRAST  Result Date: 06/06/2020 CLINICAL DATA:  Acute abdominal pain with nausea and vomiting EXAM: CT ABDOMEN AND PELVIS WITHOUT CONTRAST TECHNIQUE: Multidetector CT imaging of the abdomen and pelvis was performed following the standard protocol without IV contrast. COMPARISON:  None FINDINGS: Lower chest: No acute abnormality. Hepatobiliary: No focal liver abnormality is seen. No gallstones, gallbladder wall thickening, or biliary dilatation. Pancreas: Unremarkable. No pancreatic ductal dilatation or surrounding inflammatory changes. Spleen: Spleen is within normal limits. Adrenals/Urinary Tract: Adrenal glands are within normal limits bilaterally. Kidneys demonstrate no renal calculi or urinary tract obstructive changes. The ureters are within normal limits. Bladder is partially distended. Stomach/Bowel: Appendix is within normal limits. No obstructive or inflammatory changes of the colon are seen. Small bowel is within normal limits. Stomach is unremarkable. Vascular/Lymphatic: No significant vascular findings are present. No enlarged abdominal or pelvic lymph nodes. Circumaortic left renal vein is noted. Reproductive: Prostate is unremarkable. Other: No abdominal wall hernia or abnormality. No abdominopelvic ascites. Musculoskeletal: No acute or significant osseous findings. IMPRESSION: No acute abnormality noted. Electronically Signed   By: Alcide Clever M.D.   On: 06/06/2020 02:41    Assessment and Plan:   CLAY SOLUM is a 34 y.o. y/o male  has been referred for nausea vomiting and diarrhea  Duration of symptoms, and acute onset, suggests infectious etiology However, symptoms are ongoing at this time and infectious work-up is negative  Will obtain C. difficile testing at this time as patient reports being constipated in the hospital at the time of discharge, but has had recurrence of symptoms since hospital discharge.  Due to recent hospital stay, C. difficile testing indicated next  Also obtain fecal calprotectin and pancreatic elastase given other infectious work-up was negative recently  Okay to start Imodium after C. difficile testing returns.  This was discussed with him.  Prescription was sent to the pharmacy, but patient was asked not to start the medication until C. difficile testing is back  Patient is also reporting heartburn.  Start omeprazole as it may also help with his nausea vomiting  Has history of anxiety and depression, but states this is not contributing to his symptoms.  CT abdomen pelvis was unrevealing  Follow-up with PCP to recheck labs, and for work-up of chronic hypokalemia.  He may need referral to nephrology or endocrinology as determined by PCP.  Patient was asked to call them and set up clinic visit in 1 to 2 weeks.  Dr Sean Wolf  Speech recognition software was used to dictate the above note.

## 2020-06-12 LAB — H. PYLORI ANTIGEN, STOOL: H. Pylori Stool Ag, Eia: POSITIVE — AB

## 2020-06-14 LAB — CULTURE, BLOOD (ROUTINE X 2)
Culture: NO GROWTH
Culture: NO GROWTH
Special Requests: ADEQUATE

## 2020-06-16 LAB — PANCREATIC ELASTASE, FECAL: Pancreatic Elastase, Fecal: >500 ug Elast./g (ref >200)

## 2020-06-16 LAB — C DIFFICILE, CYTOTOXIN B

## 2020-06-16 LAB — C DIFFICILE TOXINS A+B W/RFLX: C difficile Toxins A+B, EIA: NEGATIVE

## 2020-06-16 LAB — CALPROTECTIN, FECAL: Calprotectin, Fecal: <16 ug/g (ref 0–120)

## 2020-06-17 ENCOUNTER — Telehealth: Payer: Self-pay

## 2020-06-17 MED ORDER — AMOXICILLIN 500 MG PO TABS: 1000.0000 mg | ORAL_TABLET | Freq: Two times a day (BID) | ORAL | 0 refills | Status: AC

## 2020-06-17 MED ORDER — OMEPRAZOLE 20 MG PO CPDR
20.0000 mg | DELAYED_RELEASE_CAPSULE | Freq: Every day | ORAL | 0 refills | Status: DC
Start: 2020-06-17 — End: 2021-07-28

## 2020-06-17 MED ORDER — CLARITHROMYCIN 500 MG PO TABS
500.0000 mg | ORAL_TABLET | Freq: Two times a day (BID) | ORAL | 0 refills | Status: AC
Start: 2020-06-17 — End: 2020-07-01

## 2020-06-17 NOTE — Telephone Encounter (Signed)
Patient called stating that he continues to have abdominal pain and saw on his MyChart that his stool test results are back and would like to know if he needs to start taking antibiotics soon or not. Please advise.

## 2020-06-17 NOTE — Addendum Note (Signed)
Addended by: Melodie Bouillon on: 06/17/2020 10:36 AM   Modules accepted: Orders

## 2020-06-18 NOTE — Telephone Encounter (Signed)
Patient did not answer my call. Therefore, I sent him a MyChart message letting him know what Dr. Maximino Greenland had recommended for him to do.

## 2020-07-01 ENCOUNTER — Telehealth: Payer: Self-pay

## 2020-07-01 NOTE — Telephone Encounter (Signed)
Received PA for sunosi. Completed via covermymeds.  Key: [id number]. Sent to [facility]. Should have a determination in 1-3 business days.

## 2020-07-02 NOTE — Telephone Encounter (Signed)
PA for sunosi was approved from 07/01/2020-07/01/2021. PA #:  [id number].

## 2020-07-03 ENCOUNTER — Other Ambulatory Visit: Payer: Self-pay | Admitting: Gastroenterology

## 2020-07-03 NOTE — Telephone Encounter (Signed)
Sunosi approval notice received from [facility]. [facility] on [address]. Received a receipt of confirmation.

## 2020-07-08 ENCOUNTER — Ambulatory Visit: Payer: BC Managed Care – PPO | Admitting: Gastroenterology

## 2020-07-08 ENCOUNTER — Encounter: Payer: Self-pay | Admitting: *Deleted

## 2020-07-09 DIAGNOSIS — Z8619 Personal history of other infectious and parasitic diseases: Secondary | ICD-10-CM

## 2020-08-07 ENCOUNTER — Telehealth: Payer: Self-pay | Admitting: Neurology

## 2020-08-07 ENCOUNTER — Encounter: Payer: Self-pay | Admitting: Neurology

## 2020-08-07 ENCOUNTER — Telehealth (INDEPENDENT_AMBULATORY_CARE_PROVIDER_SITE_OTHER): Payer: Medicaid Other | Admitting: Neurology

## 2020-08-07 ENCOUNTER — Telehealth: Payer: Self-pay | Admitting: Family Medicine

## 2020-08-07 DIAGNOSIS — G4730 Sleep apnea, unspecified: Secondary | ICD-10-CM | POA: Diagnosis not present

## 2020-08-07 DIAGNOSIS — G4710 Hypersomnia, unspecified: Secondary | ICD-10-CM

## 2020-08-07 DIAGNOSIS — G4733 Obstructive sleep apnea (adult) (pediatric): Secondary | ICD-10-CM | POA: Diagnosis not present

## 2020-08-07 DIAGNOSIS — Z9989 Dependence on other enabling machines and devices: Secondary | ICD-10-CM

## 2020-08-07 MED ORDER — ARMODAFINIL 150 MG PO TABS: 150.0000 mg | ORAL_TABLET | Freq: Every day | ORAL | 5 refills | Status: DC

## 2020-08-07 MED ORDER — SUNOSI 150 MG PO TABS: 150.0000 mg | ORAL_TABLET | Freq: Every day | ORAL | 5 refills | Status: DC

## 2020-08-07 NOTE — Telephone Encounter (Signed)
I have sent message to pt advising of Dr. Teofilo Pod recommendation via my chart.

## 2020-08-07 NOTE — Telephone Encounter (Signed)
I called the pt and he reports his insurance changed this first of this month and a new PA is needed for his Sunosi.  Pt was advised to send a copy of the front and back of his insurance card so we can begin processing this for him.

## 2020-08-07 NOTE — Telephone Encounter (Signed)
..   Pt understands that although there may be some limitations with this type of visit, we will take all precautions to reduce any security or privacy concerns.  Pt understands that this will be treated like an in office visit and we will file with pt's insurance, and there may be a patient responsible charge related to this service. ? ?

## 2020-08-07 NOTE — Telephone Encounter (Signed)
Pt called and stated that the pharmacy is informing him that they are needing a PA for the pt's Solriamfetol HCl (SUNOSI) 150 MG TABS. Pt states that he is needing this medication as soon as possible. Please advise.

## 2020-08-07 NOTE — Telephone Encounter (Signed)
Sunosi PA has been submitted  (Key: [id number])  [facility] is reviewing your PA request and will respond within 24 hours for Medicaid or up to 72 hours for non-Medicaid plans, based on the required timeframe determined by state or federal regulations. To check for an update later, open this request from your dashboard.  PA may be denied due to the fact that the pt has not tired/failed generic Armodafinil or Modafinil

## 2020-08-07 NOTE — Progress Notes (Signed)
Interim history:  Sean Wolf is a 34 year old right-handed gentleman with an underlying medical history of hypertension and obesity, who presents for a virtual My Chart visit for follow-up consultation of his obstructive sleep apnea, and hypersomnolence, on CPAP therapy and Sunosi for sleepiness. The patient is unaccompanied today. I last saw him on 10/30/19, At which time he was compliant with his CPAP.  He had run out of his Sunosi for about 2 weeks.  His blood pressure numbers were stable.  He wanted to increase the Sunosi 150 mg.  He was willing to monitor his blood pressure.  He did have an increase in his sertraline and was also on BuSpar.  He reported a recent divorce in October 2020.  He had a video visit with Debbora Presto, nurse practitioner on 02/01/2020, at which time he was compliant with his CPAP and also using Sunosi 150 mg daily.  Today, 08/07/2020: I reviewed his CPAP compliance data from 07/07/2020 through 08/05/2020, which is a total of 30 days, during which time he used his machine every night except for 1, percent used days greater than 4 hours at 90%, indicating excellent compliance, average usage of 7 hours and 14 minutes, residual AHI at goal at 0.5/h, leak on the low side with a 95th percentile at 5.5 L/min on a pressure of 9 cm with EPR of 2.  He reports that he ran out of Sunosi some 4 days. It helps, and using CPAP also helps, he got new supplies recently.  He has not had any changes to his other medications but was treated for H. pylori recently.  He was hospitalized for dehydration and acute kidney injury recently as well.    The patient's allergies, current medications, family history, past medical history, past social history, past surgical history and problem list were reviewed and updated as appropriate.    Previously:    I saw him on 02/08/2019, at which time he reported elevated blood pressure values in the 150s over 90s.  He had started amlodipine recently in January 2020.  He  also was started on an antidepressant, sertraline as well as BuSpar.  He was followed by mental health and was seeing a counselor for his anxiety and depression which had flared up as well.  He was taking Sunosi 75 mg daily.  He was advised to monitor his blood pressure values and if they started to improve and were stable, we could consider increasing it for his daytime somnolence.  He was reminded to be fully compliant with his CPAP.     I reviewed his CPAP compliance data from 09/25/2019 through 10/24/2019 which is a total of 30 days, during which time he used his CPAP 29 days with percent use days greater than 4 hours at 97%, indicating excellent compliance with an average usage of 7 hours and 50 minutes, residual AHI at goal at 1.1/h, leak low, with a 95th percentile at 0.1 L/min on a pressure of 9 cm with EPR of 2.        I saw him on 11/09/2018, at which time he was compliant with his CPAP. He had significant residual daytime somnolence. Talked about his sleep study results including his baseline sleep study from 07/19/2018 as well as his CPAP titration study from 08/17/2018. His Epworth sleepiness score was still elevated at 15, down from 19 out of 24 before treatment. 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   I reviewed his CPAP compliance data from 01/07/2019 through 02/05/2019 which is a total of 30 days, during which time he used his machine 22 days with percent used days greater than 4 hours at 70% indicating adequate compliance but reduced compliance compared to 3 months ago. Average usage of 7 hours and 20 minutes for days on treatment. Residual AHI at goal at 0.9 per hour, leak on the very low side for the 95th percentile at 0.5 L/m on a pressure of 9 cm with EPR of 2.     I first met him on 07/04/2018 at the request of his dentist, at  which time he reported snoring and excessive daytime somnolence, with an Epworth sleepiness score of 19 at the time. He was advised to proceed with a sleep study. He had a baseline sleep study, followed by a CPAP titration study. I went over his test results with him in detail today. Baseline sleep study from 07/19/2018 showed a sleep efficiency of 85.7%, sleep latency delayed at 44 minutes, REM latency 68.5 minutes. He had an increased percentage of slow-wave sleep and an increased percentage of REM sleep. He had a total AHI of 19.8 per hour, REM AHI was 53.6 per hour, supine AHI was 21.7 per hour, average oxygen saturation was 98%, nadir was 78%. He had no significant PLMS. He was advised to proceed with a CPAP titration study to optimize treatment settings and monitor oxygen level properly. He had a CPAP titration on 08/17/2018. Sleep efficiency was 84.7%, sleep latency 61.5 minutes, REM latency 65.5 minutes. He had an increased percentage of REM sleep again noted. He was fitted with a small full face mask due to mouth venting noted in CPAP was initiated at 5 cm and further titrated to 9 cm as the final pressure. On the setting his AHI was 0 per hour with supine REM sleep achieved an O2 nadir of 94%. He had no significant PLMS during this study and based on his test results I prescribed CPAP therapy for home use at a pressure of 9 cm.   I reviewed his CPAP compliance data from 10/09/2018 through 11/07/2018, during which time he used his machine every night with percent used days greater than 4 hours at 87%, indicating very good compliance with an average usage of 6 hours and 47 minutes, residual AHI at goal at 1.3 per hour, leak on the low side with the 95th percentile at 3.4 L/m on a pressure of 9 cm with EPR of 3.    07/04/2018: (He) reports snoring and excessive daytime somnolence. I reviewed your office records, which you kindly included. His wife has reported that he has pauses in his breathing and  snores loudly. He has also woken up with a sense of gasping for air. He has frequent morning headaches and uses BC powder for his headaches. He denies night to night nocturia except for when he takes his blood pressure medicine late at night. He is supposed to take 2 pills together daily but sometimes does not take it at all. He has gained weight. Bedtime is around 8 PM and rise time around 6 AM. He has to drive from [facility] to [place] for his work related commute and gets sleepy at the wheel, admits to having dosed off at the wheel.  His Epworth sleepiness score is 19 out of 24 today, fatigue score is 57  out of 63. He is married and lives with his wife and 2 children. He works for [employer]. He is a nonsmoker and does not utilize alcohol on a regular basis, does drink caffeine in the form of regular soda, about 4-5 cans per day on average. His mother was diagnosed with obstructive sleep apnea and had a CPAP machine, then had weight loss surgery. He reports that his father had alcoholism.  He wakes up typically with his mouth severely dry, he suspects that he is a mouth breather. He also complains of postnasal drip, typically year long.   His Past Medical History Is Significant For: Past Medical History:  Diagnosis Date   Hypertension     His Past Surgical History Is Significant For: No past surgical history on file.  His Family History Is Significant For: Family History  Problem Relation Age of Onset   Cancer Mother 42       colon   Alcohol abuse Father    Diabetes Father    Drug abuse Brother    Stroke Brother        from vegetation due to iv drug use   Cancer Maternal Grandmother        lymphoma   Cancer Paternal Grandmother        breast   Stroke Paternal Grandmother    Early death Neg Hx    Hyperlipidemia Neg Hx    Heart disease Neg Hx     His Social History Is Significant For: Social History   Socioeconomic History   Marital status: Married    Spouse name: Not  on file   Number of children: Not on file   Years of education: Not on file   Highest education level: Not on file  Occupational History    Comment: [employer]  Tobacco Use   Smoking status: Never Smoker   Smokeless tobacco: Never Used  Scientific laboratory technician Use: Never used  Substance and Sexual Activity   Alcohol use: No   Drug use: No   Sexual activity: Not on file  Other Topics Concern   Not on file  Social History Narrative   Not on file   Social Determinants of Health   Financial Resource Strain:    Difficulty of Paying Living Expenses: Not on file  Food Insecurity:    Worried About Charity fundraiser in the Last Year: Not on file   [employer] of Food in the Last Year: Not on file  Transportation Needs:    Lack of Transportation (Medical): Not on file   Lack of Transportation (Non-Medical): Not on file  Physical Activity:    Days of Exercise per Week: Not on file   Minutes of Exercise per Session: Not on file  Stress:    Feeling of Stress : Not on file  Social Connections:    Frequency of Communication with Friends and Family: Not on file   Frequency of Social Gatherings with Friends and Family: Not on file   Attends Religious Services: Not on file   Active Member of Clubs or Organizations: Not on file   Attends Archivist Meetings: Not on file   Marital Status: Not on file    His Allergies Are:  No Known Allergies:   His Current Medications Are:  Outpatient Encounter Medications as of 08/07/2020  Medication Sig   amLODipine (NORVASC) 5 MG tablet Take 5 mg by mouth daily.   busPIRone (BUSPAR) 15 MG tablet Take 15  mg by mouth daily.    cyanocobalamin (,VITAMIN B-12,) 1000 MCG/ML injection Inject 1,000 mcg into the muscle every 30 (thirty) days.    KLOR-CON M20 20 MEQ tablet Take 20 mEq by mouth daily.    loperamide (IMODIUM A-D) 2 MG tablet Take 1 tablet (2 mg total) by mouth 4 (four) times daily as needed for diarrhea or loose  stools.   losartan-hydrochlorothiazide (HYZAAR) 100-25 MG tablet Take 1 tablet by mouth daily. (Patient not taking: Reported on 06/11/2020)   omeprazole (PRILOSEC) 20 MG capsule Take 1 capsule (20 mg total) by mouth daily for 14 days.   ondansetron (ZOFRAN) 4 MG tablet Take 1 tablet (4 mg total) by mouth every 6 (six) hours as needed for nausea.   sertraline (ZOLOFT) 100 MG tablet Take 100 mg by mouth daily.   Solriamfetol HCl (SUNOSI) 150 MG TABS Take 150 mg by mouth daily.   VANISHPOINT SAFETY SYRINGE 25G X 1" 3 ML MISC See admin instructions.    [DISCONTINUED] Solriamfetol HCl (SUNOSI) 150 MG TABS Take 150 mg by mouth daily. Take half a pill once daily for the first week, then take 1 pill once daily thereafter (Patient taking differently: Take 150 mg by mouth daily. )   No facility-administered encounter medications on file as of 08/07/2020.  :  Review of Systems:  Out of a complete 14 point review of systems, all are reviewed and negative with the exception of these symptoms as listed below:  Virtual Visit via Video Note on 08/07/2020:   I connected with Sheral Apley on 08/07/20 at  9:30 AM EDT by a video enabled telemedicine application and verified that I am speaking with the correct person using two identifiers.   I discussed the limitations of evaluation and management by telemedicine and the availability of in person appointments. The patient expressed understanding and agreed to proceed.  History of Present Illness: See above   Observations/Objective: He is pleasant, conversant, in no acute distress, face symmetric, normal facial animation.  Airway examination reveals mild mouth dryness, tongue protrudes centrally and palate elevates symmetrically, shoulder height equal.  He is moving his upper body without restraint or difficulty.  Assessment and Plan: In summary,Arturo B Goreis a very pleasant73 year oldmalewith an underlying medical history of hypertension and  obesity, whopresents for a virtual, MyChart video visit for follow-up consultation of his obstructive sleep apnea andhypersomnolence. His baseline sleep study from August 2019 showed moderate to severe obstructive sleep apnea and he had good results during his titration study from September 2019. He has been on CPAP of 9 cm with full compliance and good results with treatment but has had residual daytime somnolence which has responded to [facility] which is currently 150 mg daily.   We had discussed pursuing additional sleep testing if the need arises in the future with a nocturnal polysomnogram with CPAP and next day nap study.  His mood is stable and he continues to take sertraline and BuSpar.  For now, he has good symptomatic results with Sunosi, ran out about 4 days ago.  He is reminded to let us know ahead of time if he is running low on the prescription refills.  He is advised to continue with CPAP and is commended for his treatment adherence.  He has updated supplies.  He is advised to follow-up with the nurse practitioner in 6 months, sooner if needed.  Of note, we also checked his blood work for HLA markers for narcolepsy in December 2019, and one of the  markers came back positive. Both sleep studies showed REM percentages in the 30% range, but increase in REM sleep percentage can also be seen in patients who have significant depression. We will continue to monitor his symptoms and his progress. I answered all his questions today and he was in agreement.  Follow Up Instructions:    I discussed the assessment and treatment plan with the patient. The patient was provided an opportunity to ask questions and all were answered. The patient agreed with the plan and demonstrated an understanding of the instructions.   The patient was advised to call back or seek an in-person evaluation if the symptoms worsen or if the condition fails to improve as anticipated.   I provided 20 minutes of non-face-to-face  time during this encounter.   Star Age, MD

## 2020-08-07 NOTE — Telephone Encounter (Signed)
Please advise patient, that I sent a prescription for generic Nuvigil 150 mg strength to his pharmacy, [facility] in [place], [place] as that is the preferred pharmacy he had requested earlier.   Nuvigil (generic name: Armodafinil) 150 mg strength: take 1 pill once daily in AM. Avoid after 3 PM, as it can cause difficulty falling asleep at night, ie insomnia. Side effects may include (but are not limited to): high blood pressure, headache, nervousness, palpitations, GI upset, tremor.

## 2020-08-07 NOTE — Patient Instructions (Signed)
Given verbally, during today's virtual video-based encounter, with verbal feedback received.  FU in 6 mo with NP. Continue Sunosi, continue CPAP.

## 2020-08-07 NOTE — Telephone Encounter (Signed)
Noted, thanks!

## 2020-09-09 ENCOUNTER — Other Ambulatory Visit: Payer: Self-pay | Admitting: Neurology

## 2020-10-12 ENCOUNTER — Other Ambulatory Visit: Payer: Self-pay | Admitting: Neurology

## 2021-02-04 ENCOUNTER — Encounter: Payer: Self-pay | Admitting: Family Medicine

## 2021-02-04 ENCOUNTER — Telehealth (INDEPENDENT_AMBULATORY_CARE_PROVIDER_SITE_OTHER): Payer: 59 | Admitting: Family Medicine

## 2021-02-04 DIAGNOSIS — Z9989 Dependence on other enabling machines and devices: Secondary | ICD-10-CM | POA: Diagnosis not present

## 2021-02-04 DIAGNOSIS — G4733 Obstructive sleep apnea (adult) (pediatric): Secondary | ICD-10-CM

## 2021-02-04 DIAGNOSIS — G4719 Other hypersomnia: Secondary | ICD-10-CM | POA: Diagnosis not present

## 2021-02-04 MED ORDER — ARMODAFINIL 250 MG PO TABS: 250.0000 mg | ORAL_TABLET | Freq: Every day | ORAL | 1 refills | Status: DC

## 2021-02-04 NOTE — Progress Notes (Addendum)
PATIENT: Sean Wolf DOB: Apr 12, 1986  REASON FOR VISIT: follow up HISTORY FROM: patient  Virtual Visit via Telephone Note  I connected with Sean Wolf on 02/04/21 at  9:00 AM EST by telephone and verified that I am speaking with the correct person using two identifiers.   I discussed the limitations, risks, security and privacy concerns of performing an evaluation and management service by telephone and the availability of in person appointments. I also discussed with the patient that there may be a patient responsible charge related to this service. The patient expressed understanding and agreed to proceed.   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 He is doing well on CPAP therapy. Sunosi not covered by [employer] He has tolerated armodafinil and usually takes around 8am. He feels that it works ok but wears off around noon. He feels that he would like to try a higher dose. He is working from home. BP has been good at home. Usual readings are 115-120/80's. He is seeing PCP regularly.   Compliance report dated 01/04/2021 through 02/02/2021 reveals that he used CPAP 29 of the past 30 days for compliance of 97%.  He used CPAP greater than 4 hours 28 of the past 30 days for compliance of 93%.  Average usage on days used was 7 hours and 51 minutes.  Residual AHI was 2.5 on a set pressure of 9 cm of water and EPR of 2.  There was no significant leak noted.   08/07/2020 SA:  Sean Wolf a 35year old right-handed gentleman with an underlying medical history of hypertension and obesity, who presents for a virtual My Chart visit for follow-up consultation of his obstructive sleep apnea, and hypersomnolence, on CPAP therapy and Sunosi for sleepiness. The patient is unaccompanied today. I last saw him on 10/30/19, At which time he was compliant with his CPAP.  He had run out of his Sunosi for about 2 weeks.  His  blood pressure numbers were stable.  He wanted to increase the Sunosi 150 mg.  He was willing to monitor his blood pressure.  He did have an increase in his sertraline and was also on BuSpar.  He reported a recent divorce in October 2020.  He had a video visit with ADebbora Presto nurse practitioner on 02/01/2020, at which time he was compliant with his CPAP and also using Sunosi 150 mg daily.  Today, 08/07/2020: I reviewed his CPAP compliance data from 07/07/2020 through 08/05/2020, which is a total of 30 days, during which time he used his machine every night except for 1, percent used days greater than 4 hours at 90%, indicating excellent compliance, average usage of 7 hours and 14 minutes, residual AHI at goal at 0.5/h, leak on the low side with a 95th percentile at 5.5 L/min on a pressure of 9 cm with EPR of 2.  He reports that he ran out of Sunosi some 4 days. It helps, and using CPAP also helps, he got new supplies recently.  He has not had any changes to his other medications but was treated for H. pylori recently.  He was hospitalized for dehydration and acute kidney injury recently as well.  The patient's allergies, current medications, family history, past medical history, past social history, past surgical history and problem list were reviewed and updated as appropriate.   02/01/2020 ALL:  Sean SOULESis a 35  y.o. male here today for follow up for OSA on CPAP and hypersomnolence.  He continues compliance with CPAP therapy.  Sunosi was increased to 150 mg daily at last visit in November 2020. He does feel that this has helped some. He is working from home. He is able to complete his work but does report continuing to feel the need to take daytime naps. He is not exercising. He does try to stay active with his kids.   Compliance report dated 01/01/2020 through 01/30/2020 reveals that he used CPAP 27 of the last 30 days for compliance of 90%.  He used CPAP greater than 4 hours all 27 days for compliance  of 90%.  Average usage was 8 hours and 57 minutes.  Residual AHI was 0.9 on 9 cm of water and an EPR of 2.  There was no significant leak noted.   History (copied from Dr Guadelupe Sabin note on 10/30/2019)  Sean Wolf is a 35 year old right-handed gentleman with an underlying medical history of hypertension and obesity, who presents for follow-up consultation of his obstructive sleep apnea, and hypersomnolence, on CPAP therapy and Sunosi for sleepiness. The patient is unaccompanied today. I last saw him on 02/08/2019, at which time he reported elevated blood pressure values in the 150s over 90s.  He had started amlodipine recently in January 2020.  He also was started on an antidepressant, sertraline as well as BuSpar.  He was followed by mental health and was seeing a counselor for his anxiety and depression which had flared up as well.  He was taking Sunosi 75 mg daily.  He was advised to monitor his blood pressure values and if they started to improve and were stable, we could consider increasing it for his daytime somnolence.  He was reminded to be fully compliant with his CPAP.    Today, 10/30/2019: I reviewed his CPAP compliance data from 09/25/2019 through 10/24/2019 which is a total of 30 days, during which time he used his CPAP 29 days with percent use days greater than 4 hours at 97%, indicating excellent compliance with an average usage of 7 hours and 50 minutes, residual AHI at goal at 1.1/h, leak low, with a 95th percentile at 0.1 L/min on a pressure of 9 cm with EPR of 2.  He reports that he uses his CPAP consistently.  Nevertheless, he is still sleepy during the day.  He has not been on Sunosi for the past 15 days as he ran out.  His blood pressure is better.  He would like to see about increasing the Sunosi to 150 mg daily as we discussed last time.  He does not monitor his blood pressure at home.  He could borrow his mother's blood pressure cuff to monitor it if needed.  He has had an increase in his  sertraline.  He continues to take sertraline and BuSpar and feels stable for the most part, sometimes he admits he forgets to take his sertraline for a day or 2 and feels that it does have some repercussions but he does not get to the point of stopping it completely or forgetting it more than a day or 2.  He has been working on his weight, he had lost about 15 pounds but gained most of it back.  He is motivated to continue to work on it.  He and his wife have been divorced since October 2020, were separated in March amicably.  They have joint custody over the kids.   The patient's  allergies, current medications, family history, past medical history, past social history, past surgical history and problem list were reviewed and updated as appropriate.  Previously:    I saw him on 11/09/2018, at which time he was compliant with his CPAP. He had significant residual daytime somnolence. Talked about his sleep study results including his baseline sleep study from 07/19/2018 as well as his CPAP titration study from 08/17/2018. His Epworth sleepiness score was still elevated at 15, down from 19 out of 24 before treatment. 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  I reviewed his CPAP compliance data from 01/07/2019 through 02/05/2019 which is a total of 30 days, during which time he used his machine 22 days with percent used days greater than 4 hours at 70% indicating adequate compliance but reduced compliance compared to 3 months ago. Average usage of 7 hours and 20 minutes for days on treatment. Residual AHI at goal at 0.9 per hour, leak on the very low side for the 95thpercentile at 0.5 L/m on a pressure of 9 cm with EPR of 2.   I first met him on 07/04/2018 at the request of his dentist, at which time he reported snoring and excessive daytime  somnolence, with an Epworth sleepiness score of 19 at the time. He was advised to proceed with a sleep study. He had a baseline sleep study, followed by a CPAP titration study. I went over his test results with him in detail today. Baseline sleep study from 07/19/2018 showed a sleep efficiency of 85.7%, sleep latency delayed at 44 minutes, REM latency 68.5 minutes. He had an increased percentage of slow-wave sleep and an increased percentage of REM sleep. He had a total AHI of 19.8 per hour, REM AHI was 53.6 per hour, supine AHI was 21.7 per hour, average oxygen saturation was 98%, nadir was 78%. He had no significant PLMS. He was advised to proceed with a CPAP titration study to optimize treatment settings and monitor oxygen level properly. He had a CPAP titration on 08/17/2018. Sleep efficiency was 84.7%, sleep latency 61.5 minutes, REM latency 65.5 minutes. He had an increased percentage of REM sleep again noted. He was fitted with a small full face mask due to mouth venting noted in CPAP was initiated at 5 cm and further titrated to 9 cm as the final pressure. On the setting his AHI was 0 per hour with supine REM sleep achieved an O2 nadir of 94%. He had no significant PLMS during this study and based on his test results I prescribed CPAP therapy for home use at a pressure of 9 cm.  I reviewed his CPAP compliance data from 10/09/2018 through 11/07/2018, during which time he used his machine every night with percent used days greater than 4 hours at 87%, indicating very good compliance with an average usage of 6 hours and 47 minutes, residual AHI at goal at 1.3 per hour, leak on the low side with the 95thpercentile at 3.4 L/m on a pressure of 9 cm with EPR of 3.   07/04/2018: (He) reports snoring and excessive daytime somnolence. I reviewed your office records, which you kindly included. His wife has reported that he has pauses in his breathing and snores loudly. He has also woken up with a sense of  gasping for air. He has frequent morning  headaches and uses BC powder for his headaches. He denies night to night nocturia except for when he takes his blood pressure medicine late at night. He is supposed to take 2 pills together daily but sometimes does not take it at all. He has gained weight. Bedtime is around 8 PM and rise time around 6 AM. He has to drive from [facility] to [place] for his work related commuteand gets sleepy at the wheel, admits to having dosed off at the wheel.  His Epworth sleepiness score is 19 out of 24 today, fatigue score is 57 out of 63. He is married and lives with his wife and 2 children. He works for [employer]. He is a nonsmoker and does not utilize alcohol on a regular basis, does drink caffeine in the form of regular soda, about 4-5 cans per day on average. His mother was diagnosed with obstructive sleep apnea and had a CPAP machine, then had weight loss surgery. He reports that his father had alcoholism. He wakes up typically with his mouth severely dry, he suspects that he is a mouth breather. He also complains of postnasal drip, typically year long.    Observations/Objective:  Generalized: Well developed, in no acute distress  Mentation: Alert oriented to time, place, history taking. Follows all commands speech and language fluent   Assessment and Plan:  36 y.o. year old male  has a past medical history of Hypertension. here with    ICD-10-CM   1. OSA on CPAP  G47.33    Z99.89   2. Excessive daytime sleepiness  G47.19      Terrez continues to do well on CPAP therapy.  Compliance report reveals excellent compliance.  He was encouraged to continue using CPAP nightly and for greater than 4 hours each night.  He does feel that armodafinil may be working a little bit better than Sunosi, however, he feels that this medication wears off around noon.  He is currently taking armodafinil at 8 AM.  I will increase armodafinil to 250 mg daily.  We have discussed  healthy lifestyle habits and the need to take frequent breaks which could help increase energy through his workday.  I have encouraged him to consider lifestyle changes with well-balanced diet, regular exercise and adequate hydration.  Blood pressure readings at home have been normal.  He will continue to work with primary care for blood pressure and anxiety management.  He will follow-up in 6 months, sooner if needed.  He verbalizes understanding and agreement with this plan.   No orders of the defined types were placed in this encounter.   Meds ordered this encounter  Medications  . Armodafinil 250 MG tablet    Sig: Take 1 tablet (250 mg total) by mouth daily.    Dispense:  90 tablet    Refill:  1    Order Specific Question:   Supervising Provider    Answer:   Melvenia Beam [id number]     Follow Up Instructions:  I discussed the assessment and treatment plan with the patient. The patient was provided an opportunity to ask questions and all were answered. The patient agreed with the plan and demonstrated an understanding of the instructions.   The patient was advised to call back or seek an in-person evaluation if the symptoms worsen or if the condition fails to improve as anticipated.  I provided 20 minutes of non-face-to-face time during this encounter.  Patient is located in his place of residence during my chart  visit.  Provider is in the office.   Debbora Presto, NP   I reviewed the above note and documentation by the Nurse Practitioner and agree with the history, exam, assessment and plan as outlined above. I was available for consultation. Star Age, MD, PhD [facility])

## 2021-03-14 IMAGING — CT CT ABD-PELV W/O CM
2 of 4 series · 16 of 46 positions shown, 18 images · non-contrast
Comparison: None

CLINICAL DATA: Acute abdominal pain with nausea and vomiting

EXAM:
CT ABDOMEN AND PELVIS WITHOUT CONTRAST
TECHNIQUE: Multidetector CT imaging of the abdomen and pelvis was performed
following the standard protocol without IV contrast.

[Series 2: routine abd/pel wo · axial · 0.91mm/px · z∈[-1116,-626]mm · 13 of 108 slices shown, 15 images]
[im 5/108  soft-tissue]
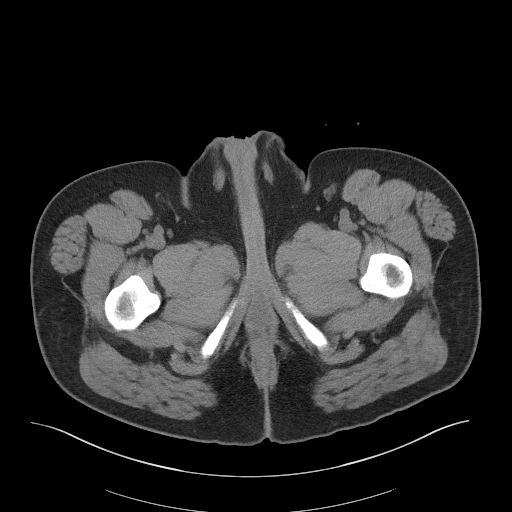
[im 5/108  bone]
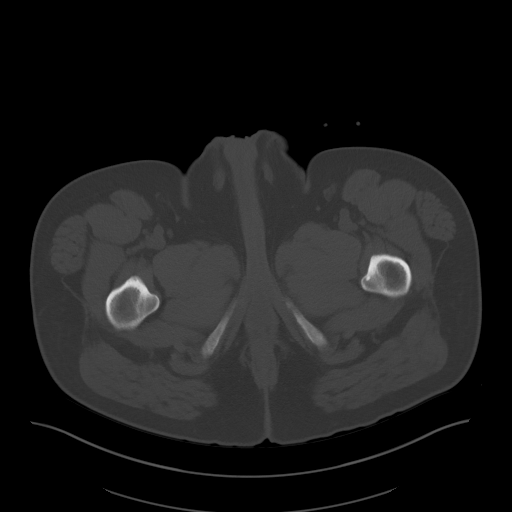
[im 13/108  soft-tissue]
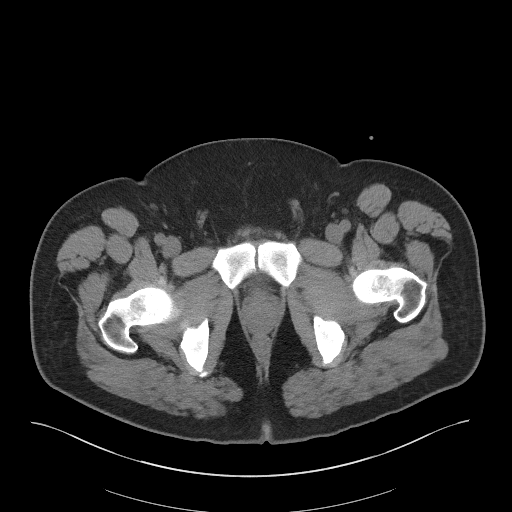
[im 22/108  soft-tissue]
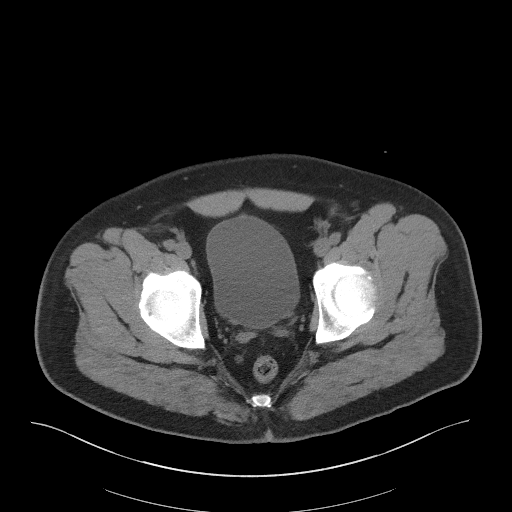
[im 30/108  soft-tissue]
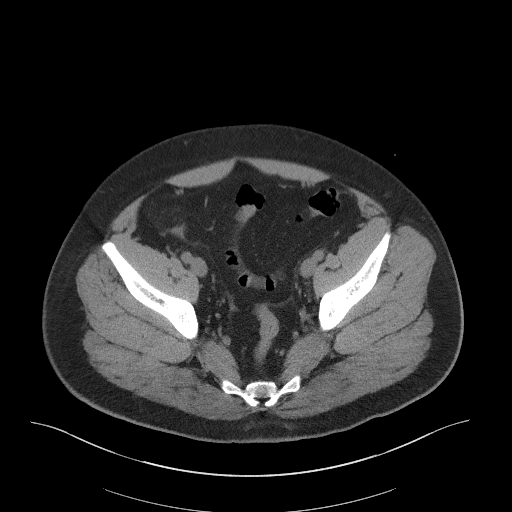
[im 39/108  soft-tissue]
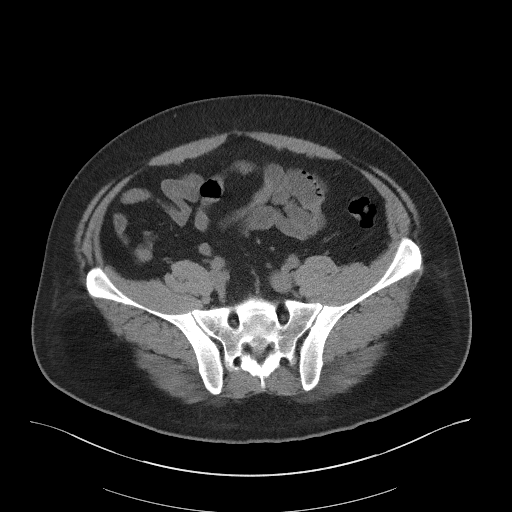
[im 48/108  soft-tissue]
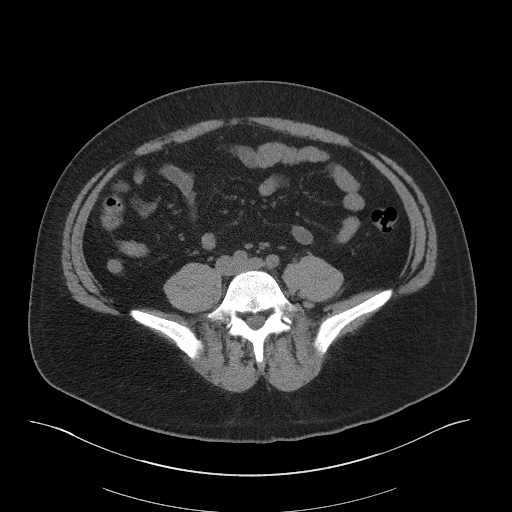
[im 56/108  soft-tissue]
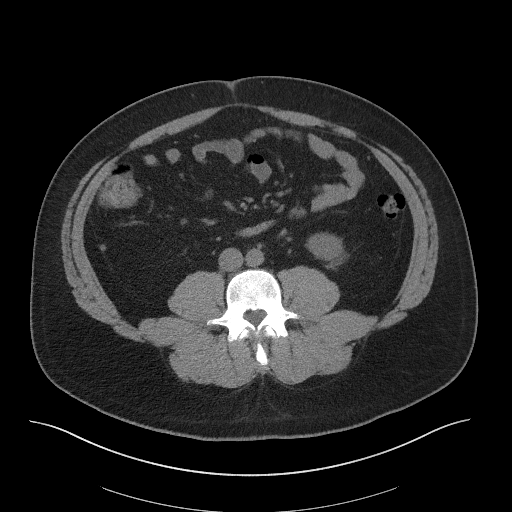
[im 60/108  soft-tissue]
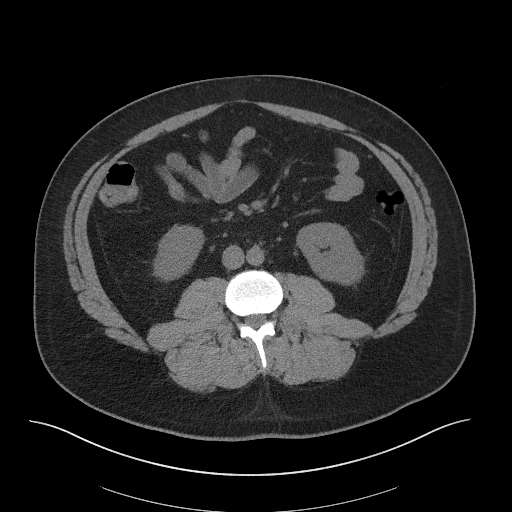
[im 69/108  soft-tissue]
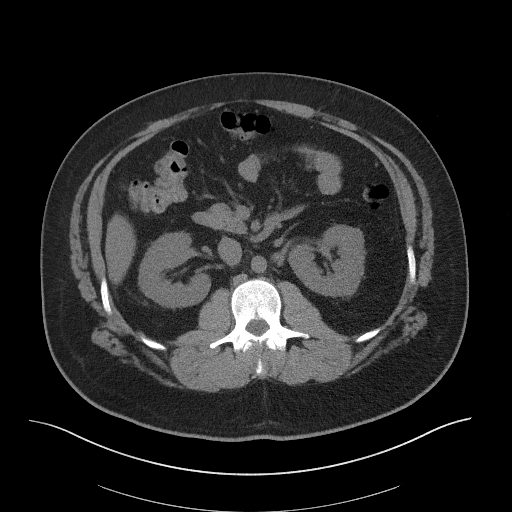
[im 69/108  bone]
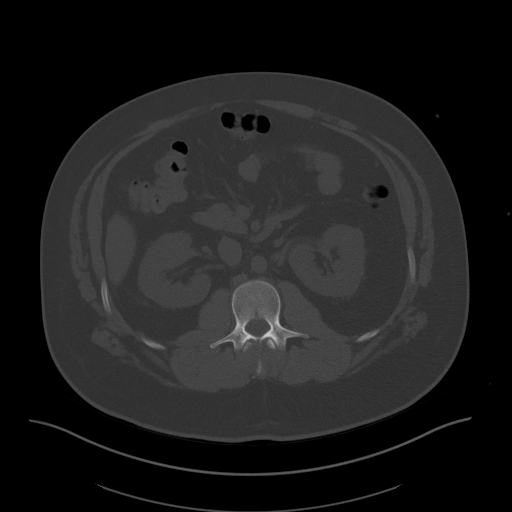
[im 78/108  soft-tissue]
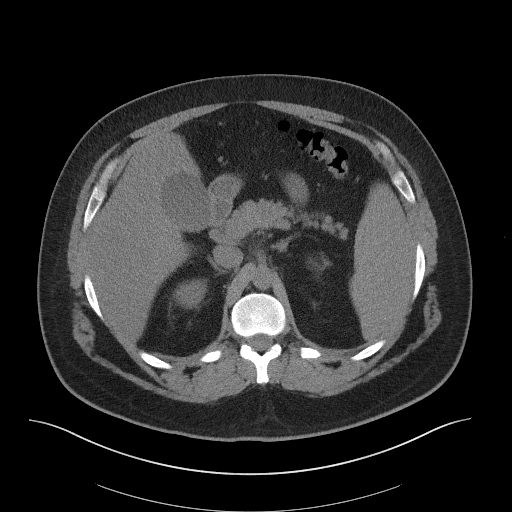
[im 86/108  soft-tissue]
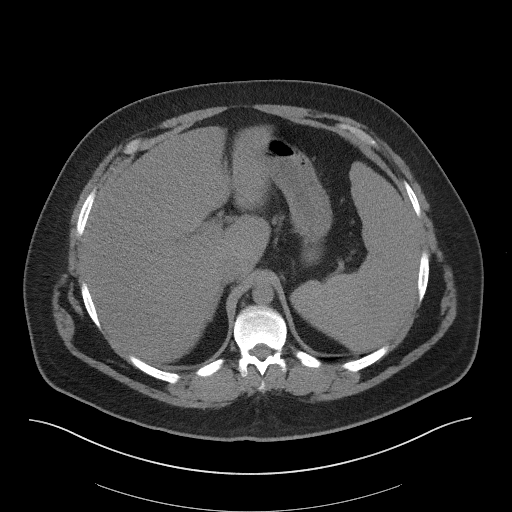
[im 95/108  soft-tissue]
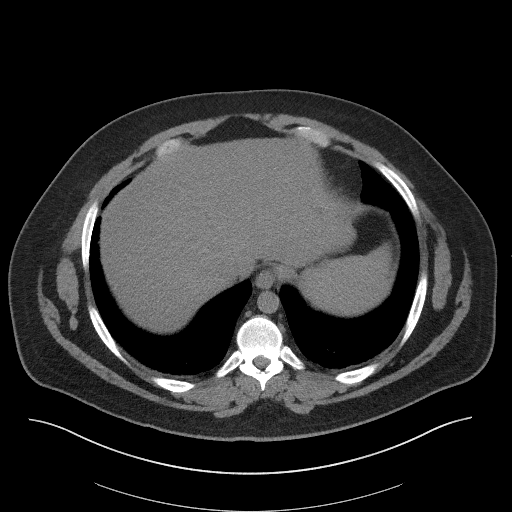
[im 103/108  soft-tissue]
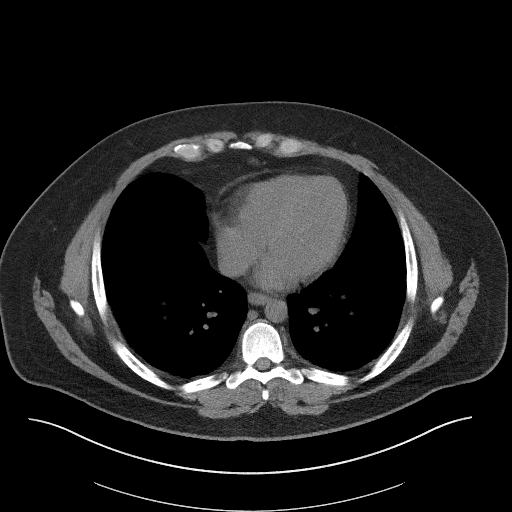

[Series 5: coronal st · coronal · 0.76mm/px · 3 of 95 slices shown]
[im 32/95  soft-tissue]
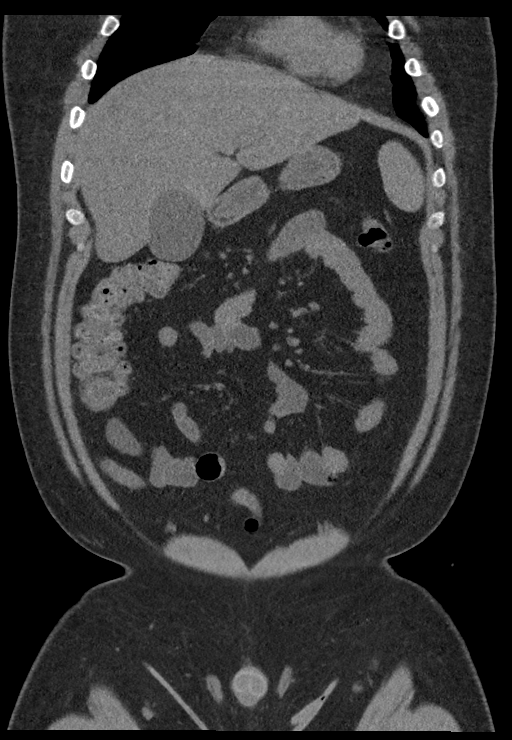
[im 42/95  soft-tissue]
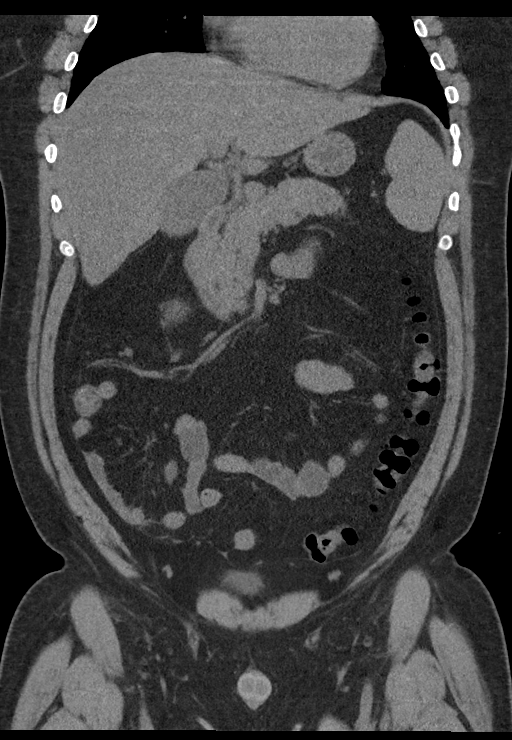
[im 53/95  soft-tissue]
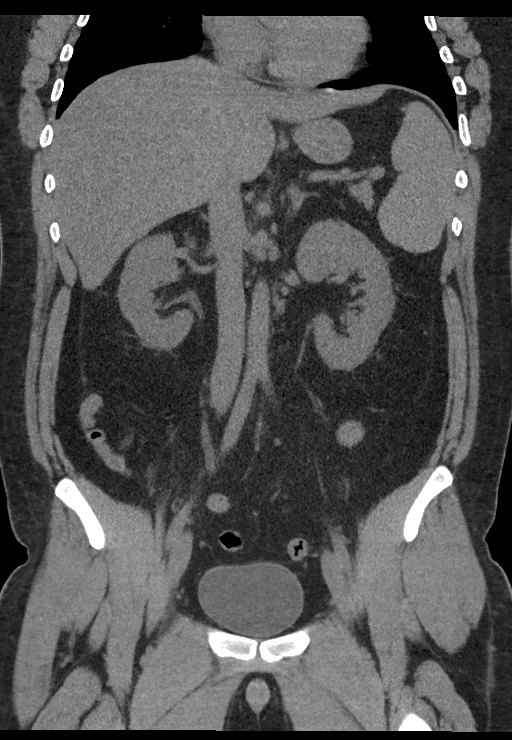

[16 of 46 positions shown; findings below may reference images not displayed]

FINDINGS: Lower chest: No acute abnormality.

Hepatobiliary: No focal liver abnormality is seen. No gallstones,
gallbladder wall thickening, or biliary dilatation.

Pancreas: Unremarkable. No pancreatic ductal dilatation or
surrounding inflammatory changes.

Spleen: Spleen is within normal limits.

Adrenals/Urinary Tract: Adrenal glands are within normal limits
bilaterally. Kidneys demonstrate no renal calculi or urinary tract
obstructive changes. The ureters are within normal limits. Bladder
is partially distended.

Stomach/Bowel: Appendix is within normal limits. No obstructive or
inflammatory changes of the colon are seen. Small bowel is within
normal limits. Stomach is unremarkable.

Vascular/Lymphatic: No significant vascular findings are present. No
enlarged abdominal or pelvic lymph nodes. Circumaortic left renal
vein is noted.

Reproductive: Prostate is unremarkable.

Other: No abdominal wall hernia or abnormality. No abdominopelvic
ascites.

Musculoskeletal: No acute or significant osseous findings.
IMPRESSION: No acute abnormality noted.

## 2021-07-28 ENCOUNTER — Encounter: Payer: Self-pay | Admitting: Family Medicine

## 2021-07-28 ENCOUNTER — Ambulatory Visit (INDEPENDENT_AMBULATORY_CARE_PROVIDER_SITE_OTHER): Payer: 59 | Admitting: Family Medicine

## 2021-07-28 VITALS — BP 138/87 | HR 74 | Ht 71.0 in | Wt 282.0 lb

## 2021-07-28 DIAGNOSIS — G4733 Obstructive sleep apnea (adult) (pediatric): Secondary | ICD-10-CM | POA: Diagnosis not present

## 2021-07-28 DIAGNOSIS — Z9989 Dependence on other enabling machines and devices: Secondary | ICD-10-CM

## 2021-07-28 DIAGNOSIS — G4719 Other hypersomnia: Secondary | ICD-10-CM | POA: Diagnosis not present

## 2021-07-28 NOTE — Progress Notes (Addendum)
PATIENT: Sean Wolf DOB: [birth year]  REASON FOR VISIT: follow up HISTORY FROM: patient  Chief Complaint  Patient presents with   Follow-up    Pt alone, rm 2. Pt states that he wanted to talk about alternative treatment options. DME: [facility], still uses machine.      HISTORY OF PRESENT ILLNESS: 07/28/21 ALL:  Sean Wolf is a 35 y.o. male here today for follow up for OSA on CPAP and EDS. He continues to use CPAP most every night. There have been a few nights where he falls short of meeting 4 hours but usually uses therapy for about 6 hours every night. He continues Armodafinil 250mg  daily. He does feel that it helps with EDS. He feels his best after getting up and taking medication. He feels alert for 3-4 hours then feels he could "pass out asleep" at any time. He has had a hard time getting this medication form the pharmacy. Last filled 07/18/2021. Medication is on backorder. He continues to battle fatigue. He is considering consult with bariatric surgery to help with weight loss. He admits that he could work on healthier lifestyle habits. He is not exercising.     02/04/21 ALL:   He returns today for follow up for OSA on CPAP and EDS on armodafinil 150mg  daily. He is doing well on CPAP therapy. Sunosi not covered by 04/06/21. He has tolerated armodafinil and usually takes around 8am. He feels that it works ok but wears off around noon. He feels that he would like to try a higher dose. He is working from home. BP has been good at home. Usual readings are 115-120/80's. He is seeing PCP regularly.    Compliance report dated 01/04/2021 through 02/02/2021 reveals that he used CPAP 29 of the past 30 days for compliance of 97%.  He used CPAP greater than 4 hours 28 of the past 30 days for compliance of 93%.  Average usage on days used was 7 hours and 51 minutes.  Residual AHI was 2.5 on a set pressure of 9 cm of water and EPR of 2.  There was no significant leak  noted.   REVIEW OF SYSTEMS: Out of a complete 14 system review of symptoms, the patient complains only of the following symptoms, fatigue, daytime sleepiness and all other reviewed systems are negative.  ESS: 20 FSS: 63  ALLERGIES: No Known Allergies  HOME MEDICATIONS: Outpatient Medications Prior to Visit  Medication Sig Dispense Refill   amLODipine (NORVASC) 5 MG tablet Take 5 mg by mouth daily.     Armodafinil 250 MG tablet Take 1 tablet (250 mg total) by mouth daily. 90 tablet 1   busPIRone (BUSPAR) 15 MG tablet Take 15 mg by mouth 2 (two) times daily.     cyanocobalamin (,VITAMIN B-12,) 1000 MCG/ML injection Inject 1,000 mcg into the muscle every 30 (thirty) days.      emtricitabine-tenofovir (TRUVADA) 200-300 MG tablet Take 1 tablet by mouth daily.     KLOR-CON M20 20 MEQ tablet Take 20 mEq by mouth daily.      losartan-hydrochlorothiazide (HYZAAR) 100-25 MG tablet Take 1 tablet by mouth daily.     VANISHPOINT SAFETY SYRINGE 25G X 1" 3 ML MISC See admin instructions.      loperamide (IMODIUM A-D) 2 MG tablet Take 1 tablet (2 mg total) by mouth 4 (four) times daily as needed for diarrhea or loose stools. 30 tablet 0   omeprazole (PRILOSEC) 20 MG capsule Take 1 capsule (  20 mg total) by mouth daily for 14 days. 14 capsule 0   ondansetron (ZOFRAN) 4 MG tablet Take 1 tablet (4 mg total) by mouth every 6 (six) hours as needed for nausea. 20 tablet 0   sertraline (ZOLOFT) 100 MG tablet Take 100 mg by mouth daily.     No facility-administered medications prior to visit.    PAST MEDICAL HISTORY: Past Medical History:  Diagnosis Date   Hypertension     PAST SURGICAL HISTORY: No past surgical history on file.  FAMILY HISTORY: Family History  Problem Relation Age of Onset   Cancer Mother 12       colon   Alcohol abuse Father    Diabetes Father    Drug abuse Brother    Stroke Brother        from vegetation due to iv drug use   Cancer Maternal Grandmother        lymphoma    Cancer Paternal Grandmother        breast   Stroke Paternal Grandmother    Early death Neg Hx    Hyperlipidemia Neg Hx    Heart disease Neg Hx     SOCIAL HISTORY: Social History   Socioeconomic History   Marital status: Married    Spouse name: Not on file   Number of children: Not on file   Years of education: Not on file   Highest education level: Not on file  Occupational History    Comment: [employer] [employer]  Tobacco Use   Smoking status: Never   Smokeless tobacco: Never  Vaping Use   Vaping Use: Never used  Substance and Sexual Activity   Alcohol use: No   Drug use: No   Sexual activity: Not on file  Other Topics Concern   Not on file  Social History Narrative   Not on file   Social Determinants of Health   Financial Resource Strain: Not on file  Food Insecurity: Not on file  Transportation Needs: Not on file  Physical Activity: Not on file  Stress: Not on file  Social Connections: Not on file  Intimate Partner Violence: Not on file     PHYSICAL EXAM  Vitals:   07/28/21 0822  BP: 138/87  Pulse: 74  Weight: 282 lb (127.9 kg)  Height: 5\' 11"  (1.803 m)   Body mass index is 39.33 kg/m.  Generalized: Well developed, in no acute distress  Cardiology: normal rate and rhythm, no murmur noted Respiratory: clear to auscultation bilaterally  Neurological examination  Mentation: Alert oriented to time, place, history taking. Follows all commands speech and language fluent Cranial nerve II-XII: Pupils were equal round reactive to light. Extraocular movements were full, visual field were full  Motor: The motor testing reveals 5 over 5 strength of all 4 extremities. Good symmetric motor tone is noted throughout.  Gait and station: Gait is normal.    DIAGNOSTIC DATA (LABS, IMAGING, TESTING) - I reviewed patient records, labs, notes, testing and imaging myself where available.  No flowsheet data found.   Lab Results  Component Value Date   WBC 12.6 (H) 06/10/2020    HGB 10.8 (L) 06/10/2020   HCT 31.3 (L) 06/10/2020   MCV 85.1 06/10/2020   PLT 164 06/10/2020      Component Value Date/Time   NA 137 06/10/2020 0410   NA 144 11/10/2017 0834   K 4.1 06/10/2020 0410   CL 110 06/10/2020 0410   CO2 24 06/10/2020 0410   GLUCOSE 90 06/10/2020  0410   BUN 8 06/10/2020 0410   BUN 10 11/10/2017 0834   CREATININE 1.18 06/10/2020 0410   CALCIUM 7.4 (L) 06/10/2020 0410   PROT 5.4 (L) 06/07/2020 0805   PROT 6.6 11/10/2017 0834   ALBUMIN 2.8 (L) 06/07/2020 0805   ALBUMIN 4.5 11/10/2017 0834   AST 46 (H) 06/07/2020 0805   ALT 35 06/07/2020 0805   ALKPHOS 81 06/07/2020 0805   BILITOT 0.9 06/07/2020 0805   BILITOT 0.3 11/10/2017 0834   GFRNONAA >60 06/10/2020 0410   GFRAA >60 06/10/2020 0410   Lab Results  Component Value Date   CHOL 211 (H) 11/10/2017   HDL 43 11/10/2017   LDLCALC 147 (H) 11/10/2017   TRIG 107 11/10/2017   CHOLHDL 4.9 11/10/2017   Lab Results  Component Value Date   HGBA1C 5.0 04/27/2017   No results found for: VITAMINB12 Lab Results  Component Value Date   TSH 1.100 11/10/2017     ASSESSMENT AND PLAN 35 y.o. year old male  has a past medical history of Hypertension. here with     ICD-10-CM   1. OSA on CPAP  G47.33 For home use only DME continuous positive airway pressure (CPAP)   Z99.89     2. Excessive daytime sleepiness  G47.19        Sean Wolf is doing well on CPAP therapy. Compliance report reveals excellent daily and acceptable 4 hour compliance. He was encouraged to continue using CPAP nightly and for greater than 4 hours each night. We have had a lengthy conversation regarding his concerns of EDS. Armodafinil 250mg  daily does help but he continues to feel that excessive sleepiness negatively effects his lifestyle. I have reviewed previous sleep study and labs. 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 He would like to consider weaning these  medications to pursue testing. He will continue armodafinil 250mg  daily for now. We will update supply orders as indicated. Risks of untreated sleep apnea review and education materials provided. Healthy lifestyle habits encouraged. He will follow up in 4-6 months with Dr to discuss concerns of narcolepsy and testing requirements. He verbalizes understanding and agreement with this plan.    Orders Placed This Encounter  Procedures   For home use only DME continuous positive airway pressure (CPAP)    Supplies    Order Specific Question:   Length of Need    Answer:   Lifetime    Order Specific Question:   Patient has OSA or probable OSA    Answer:   Yes    Order Specific Question:   Is the patient currently using CPAP in the home    Answer:   Yes    Order Specific Question:   Settings    Answer:   Other see comments    Order Specific Question:   CPAP supplies needed    Answer:   Mask, headgear, cushions, filters, heated tubing and water chamber      No orders of the defined types were placed in this encounter.     , FNP-C 07/28/2021, 12:30 PM [facility] [address] [place], [address] [place] [phone]  I reviewed the above note and documentation by the Nurse Practitioner and agree with the history, exam, assessment and plan as outlined above. I was available for consultation. [id number], MD, PhD [facility])

## 2021-07-28 NOTE — Progress Notes (Signed)
CM sent to [facility] 

## 2021-07-28 NOTE — Patient Instructions (Signed)
Please continue using your CPAP regularly. While your insurance requires that you use CPAP at least 4 hours each night on 70% of the nights, I recommend, that you not skip any nights and use it throughout the night if you can. Getting used to CPAP and staying with the treatment long term does take time and patience and discipline. Untreated obstructive sleep apnea when it is moderate to severe can have an adverse impact on cardiovascular health and raise her risk for heart disease, arrhythmias, hypertension, congestive heart failure, stroke and diabetes. Untreated obstructive sleep apnea causes sleep disruption, nonrestorative sleep, and sleep deprivation. This can have an impact on your day to day functioning and cause daytime sleepiness and impairment of cognitive function, memory loss, mood disturbance, and problems focussing. Using CPAP regularly can improve these symptoms.   Please continue Armodafinil 250mg  daily. Please talk with your pharmacist about how to ensure that medication is in stock.   Follow up in 4-6 months

## 2021-08-19 ENCOUNTER — Encounter: Payer: Self-pay | Admitting: Family Medicine

## 2021-08-20 ENCOUNTER — Telehealth: Payer: Self-pay | Admitting: Neurology

## 2021-08-20 MED ORDER — ARMODAFINIL 250 MG PO TABS: 250.0000 mg | ORAL_TABLET | Freq: Every day | ORAL | 1 refills | Status: DC

## 2021-08-20 NOTE — Telephone Encounter (Signed)
PA submitted through [employer] [id number] Awaiting response

## 2021-08-20 NOTE — Telephone Encounter (Signed)
PA Approved  [id number];[id number];Review Type:Prior Auth;Coverage Start Date:07/21/2021;Coverage End Date:08/20/2022;

## 2021-09-10 ENCOUNTER — Encounter: Payer: Self-pay | Admitting: Family Medicine

## 2021-09-15 ENCOUNTER — Ambulatory Visit: Payer: 59 | Admitting: Neurology

## 2022-02-05 ENCOUNTER — Other Ambulatory Visit: Payer: Self-pay

## 2022-02-05 ENCOUNTER — Emergency Department: Payer: 59

## 2022-02-05 ENCOUNTER — Emergency Department
Admission: EM | Admit: 2022-02-05 | Discharge: 2022-02-05 | Disposition: A | Discharge status: Home / Self Care | Payer: 59 | Attending: Emergency Medicine | Admitting: Emergency Medicine

## 2022-02-05 DIAGNOSIS — Z20822 Contact with and (suspected) exposure to covid-19: Secondary | ICD-10-CM | POA: Diagnosis not present

## 2022-02-05 DIAGNOSIS — R059 Cough, unspecified: Secondary | ICD-10-CM | POA: Diagnosis present

## 2022-02-05 DIAGNOSIS — J069 Acute upper respiratory infection, unspecified: Secondary | ICD-10-CM | POA: Insufficient documentation

## 2022-02-05 DIAGNOSIS — I10 Essential (primary) hypertension: Secondary | ICD-10-CM | POA: Insufficient documentation

## 2022-02-05 DIAGNOSIS — J189 Pneumonia, unspecified organism: Secondary | ICD-10-CM | POA: Diagnosis not present

## 2022-02-05 LAB — COMPREHENSIVE METABOLIC PANEL
ALT: 31 U/L (ref 0–44)
AST: 26 U/L (ref 15–41)
Albumin: 3.9 g/dL (ref 3.5–5.0)
Alkaline Phosphatase: 85 U/L (ref 38–126)
Anion gap: 12 (ref 5–15)
BUN: 12 mg/dL (ref 6–20)
CO2: 25 mmol/L (ref 22–32)
Calcium: 8.9 mg/dL (ref 8.9–10.3)
Chloride: 100 mmol/L (ref 98–111)
Creatinine, Ser: 1.14 mg/dL (ref 0.61–1.24)
GFR, Estimated: >60 mL/min (ref >60)
Glucose, Bld: 116 mg/dL — ABNORMAL HIGH (ref 70–99)
Potassium: 2.9 mmol/L — ABNORMAL LOW (ref 3.5–5.1)
Sodium: 137 mmol/L (ref 135–145)
Total Bilirubin: 0.7 mg/dL (ref 0.3–1.2)
Total Protein: 7.2 g/dL (ref 6.5–8.1)

## 2022-02-05 LAB — CBC
HCT: 39.8 % (ref 39.0–52.0)
Hemoglobin: 13.8 g/dL (ref 13.0–17.0)
MCH: 29.2 pg (ref 26.0–34.0)
MCHC: 34.7 g/dL (ref 30.0–36.0)
MCV: 84.3 fL (ref 80.0–100.0)
Platelets: 260 10*3/uL (ref 150–400)
RBC: 4.72 MIL/uL (ref 4.22–5.81)
RDW: 13.2 % (ref 11.5–15.5)
WBC: 8.8 10*3/uL (ref 4.0–10.5)
nRBC: 0 % (ref 0.0–0.2)

## 2022-02-05 LAB — RESP PANEL BY RT-PCR (FLU A&B, COVID) ARPGX2
Influenza A by PCR: NEGATIVE
Influenza B by PCR: NEGATIVE
SARS Coronavirus 2 by RT PCR: NEGATIVE

## 2022-02-05 MED ORDER — KETOROLAC TROMETHAMINE 30 MG/ML IJ SOLN
30.0000 mg | Freq: Once | INTRAMUSCULAR | Status: AC
  Administered 2022-02-05: 08:00:00 30 mg via INTRAVENOUS
  Filled 2022-02-05: qty 1

## 2022-02-05 MED ORDER — ONDANSETRON HCL 4 MG/2ML IJ SOLN
4.0000 mg | Freq: Once | INTRAMUSCULAR | Status: AC
Start: 2022-02-05 — End: 2022-02-05
  Administered 2022-02-05: 08:00:00 4 mg via INTRAVENOUS
  Filled 2022-02-05: qty 2

## 2022-02-05 MED ORDER — ONDANSETRON 4 MG PO TBDP: 4.0000 mg | ORAL_TABLET | Freq: Three times a day (TID) | ORAL | 0 refills | Status: DC | PRN

## 2022-02-05 MED ORDER — SODIUM CHLORIDE 0.9 % IV BOLUS
1000.0000 mL | Freq: Once | INTRAVENOUS | Status: AC
  Administered 2022-02-05: 08:00:00 1000 mL via INTRAVENOUS

## 2022-02-05 MED ORDER — AZITHROMYCIN 250 MG PO TABS: ORAL_TABLET | ORAL | 0 refills | Status: AC

## 2022-02-05 NOTE — ED Notes (Signed)
EKG obtained at this time.

## 2022-02-05 NOTE — ED Triage Notes (Signed)
Pt states they found amoxicillin at home and has been taking it since Tuesday, not prescribed to pt, unsure when it is from.  ?

## 2022-02-05 NOTE — ED Provider Notes (Signed)
? ?[facility] ?Provider Note ? ? ? Event Date/Time  ? First MD Initiated Contact with Patient 02/05/22 0719   ?  (approximate) ? ?History  ? ?Chief Complaint: Flu like symptoms ? ?HPI ? ?Sean Wolf is a 36 y.o. male with a past medical history of hypertension who presents to the emergency department for cough congestion fever and chills.  According to the patient since Sunday he has been experiencing cough congestion subjective fever, chills and body aches.  States his boyfriend as well as his son are both sick with similar symptoms over a similar timeframe as well.  They have all taken home COVID test that were negative.  Patient states he was feeling worse today so he came to the emergency department for evaluation.  Patient states he is also been experiencing diarrhea but denies any vomiting. ? ?Physical Exam  ? ?Triage Vital Signs: ?ED Triage Vitals  ?Enc Vitals Group  ?   BP 02/05/22 0716 126/77  ?   Pulse Rate 02/05/22 0716 98  ?   Resp 02/05/22 0716 (!) 98  ?   Temp 02/05/22 0716 98.9 ?F (37.2 ?C)  ?   Temp Source 02/05/22 0716 Oral  ?   SpO2 02/05/22 0716 97 %  ?   Weight 02/05/22 0716 281 lb 12 oz (127.8 kg)  ?   Height 02/05/22 0716 5\' 11"  (1.803 m)  ?   Head Circumference --   ?   Peak Flow --   ?   Pain Score 02/05/22 0715 5  ?   Pain Loc --   ?   Pain Edu? --   ?   Excl. in [place]? --   ? ? ?Most recent vital signs: ?Vitals:  ? 02/05/22 0716  ?BP: 126/77  ?Pulse: 98  ?Resp: (!) 98  ?Temp: 98.9 ?F (37.2 ?C)  ?SpO2: 97%  ? ? ?General: Awake, no distress.  ?CV:  Good peripheral perfusion.  Regular rate and rhythm  ?Resp:  Normal effort.  Equal breath sounds bilaterally.  ?Abd:  No distention.  Soft, nontender.  No rebound or guarding. ?Other:  Occasional cough during examination. ? ? ?ED Results / Procedures / Treatments  ? ?EKG ? ?EKG viewed and interpreted by myself shows a normal sinus rhythm at 93 bpm, narrow QRS, normal axis, normal intervals, no concerning ST  changes. ? ?RADIOLOGY ? ?I personally viewed the chest x-ray images appears to have some haziness in the lower lung fields. ?Radiology is read the x-ray as atelectasis versus early bronchopneumonia. ? ? ?MEDICATIONS ORDERED IN ED: ?Medications  ?sodium chloride 0.9 % bolus 1,000 mL (has no administration in time range)  ?ketorolac (TORADOL) 30 MG/ML injection 30 mg (has no administration in time range)  ?ondansetron (ZOFRAN) injection 4 mg (has no administration in time range)  ? ? ? ?IMPRESSION / MDM / ASSESSMENT AND PLAN / ED COURSE  ?I reviewed the triage vital signs and the nursing notes. ? ?Patient presents to the emergency department for 5 days of cough congestion subjective fever chills body aches.  Vital signs are reassuring in the emergency department does have an occasional wet sounding cough during my examination.  Patient to be started labs, chest x-ray, fluids, Toradol, Zofran.  We will swab for COVID/influenza.  Patient agreeable to plan. ? ?Patient's labs are resulted largely within normal limits.  Normal CBC, normal chemistry.  COVID/flu is negative.  Vital signs remain reassuring.  Chest x-ray shows possible atelectasis versus early bronchopneumonia.  Given the patient's wet sounding cough congestion reported fever at home we will cover with Zithromax as a precaution.  Discussed return precautions patient agreeable to plan of care. ? ?FINAL CLINICAL IMPRESSION(S) / ED DIAGNOSES  ? ?Upper respiratory infection ?Pneumonia ? ? ?Note:  This document was prepared using Dragon voice recognition software and may include unintentional dictation errors. ?  Harvest Dark, MD ?02/05/22 [phone] ? ?

## 2022-02-05 NOTE — ED Triage Notes (Signed)
From home via [facility], cough, nausea, diarrhea, fever, body aches, since Sunday. States boyfriend has been sick lately. Covid test negative, has not been to the doctor. Pt reports no vomiting, diarrhea x4 times yesterday. Pt Aox4 in NAD.  ?

## 2022-02-05 NOTE — ED Notes (Signed)
Purple and green top sent to lab at this time ?

## 2022-02-27 ENCOUNTER — Encounter: Payer: Self-pay | Admitting: Family Medicine

## 2022-03-02 ENCOUNTER — Other Ambulatory Visit: Payer: Self-pay | Admitting: *Deleted

## 2022-03-02 MED ORDER — ARMODAFINIL 250 MG PO TABS: 250.0000 mg | ORAL_TABLET | Freq: Every day | ORAL | 0 refills | Status: DC

## 2022-03-02 NOTE — Progress Notes (Signed)
Done. Thank yo

## 2022-03-30 ENCOUNTER — Telehealth: Payer: Self-pay

## 2022-03-30 NOTE — Telephone Encounter (Signed)
Received a PA request via [employer] for patient's armodafinil with [place] Medicaid insurance. ? ?I called patient. He reports that he has [employer]. His PA for armodafinil is good through September 2023. ? ?I called [facility]. They do not have [employer] on file and the patient should call them to provide this information. ?

## 2022-06-03 ENCOUNTER — Other Ambulatory Visit: Payer: Self-pay | Admitting: Neurology

## 2022-06-08 ENCOUNTER — Telehealth: Payer: Self-pay | Admitting: Family Medicine

## 2022-06-08 NOTE — Telephone Encounter (Signed)
Reviewed pt chart. Last seen 07/28/21 and next f/u 06/17/22.  Checked drug registry. Last refilled 05/04/22 #30. Sent request to AL,NP to e-scribe prescription.

## 2022-06-08 NOTE — Telephone Encounter (Signed)
Pt states he is out of Armodafinil 250 MG tablet , he would like to have enough called in until his [facility] [phone]

## 2022-06-16 NOTE — Progress Notes (Unsigned)
PATIENT: Sean Wolf DOB: 07/24/1986  REASON FOR VISIT: follow up HISTORY FROM: patient  No chief complaint on file.   HISTORY OF PRESENT ILLNESS:  06/16/22 ALL:  Sean Wolf returns for follow up for sleep apnea on CPAP with EDS on armodafinil 250mg  daily.    07/28/2021 ALL: Sean Wolf is a 36 y.o. male here today for follow up for OSA on CPAP and EDS. He continues to use CPAP most every night. There have been a few nights where he falls short of meeting 4 hours but usually uses therapy for about 6 hours every night. He continues Armodafinil 250mg  daily. He does feel that it helps with EDS. He feels his best after getting up and taking medication. He feels alert for 3-4 hours then feels he could "pass out asleep" at any time. He has had a hard time getting this medication form the pharmacy. Last filled 07/18/2021. Medication is on backorder. He continues to battle fatigue. He is considering consult with bariatric surgery to help with weight loss. He admits that he could work on healthier lifestyle habits. He is not exercising.     02/04/21 ALL:   He returns today for follow up for OSA on CPAP and EDS on armodafinil 150mg  daily. He is doing well on CPAP therapy. Sunosi not covered by 07/20/2021. He has tolerated armodafinil and usually takes around 8am. He feels that it works ok but wears off around noon. He feels that he would like to try a higher dose. He is working from home. BP has been good at home. Usual readings are 115-120/80's. He is seeing PCP regularly.    Compliance report dated 01/04/2021 through 02/02/2021 reveals that he used CPAP 29 of the past 30 days for compliance of 97%.  He used CPAP greater than 4 hours 28 of the past 30 days for compliance of 93%.  Average usage on days used was 7 hours and 51 minutes.  Residual AHI was 2.5 on a set pressure of 9 cm of water and EPR of 2.  There was no significant leak noted.   REVIEW OF SYSTEMS: Out of a complete 14 system review of  symptoms, the patient complains only of the following symptoms, fatigue, daytime sleepiness and all other reviewed systems are negative.  ESS: 20 FSS: 63  ALLERGIES: No Known Allergies  HOME MEDICATIONS: Outpatient Medications Prior to Visit  Medication Sig Dispense Refill   amLODipine (NORVASC) 10 MG tablet Take 10 mg by mouth daily.     Armodafinil 250 MG tablet TAKE 1 TABLET BY MOUTH EVERY DAY 30 tablet 0   busPIRone (BUSPAR) 15 MG tablet Take 15 mg by mouth 2 (two) times daily.     emtricitabine-tenofovir (TRUVADA) 200-300 MG tablet Take 1 tablet by mouth daily.     FLUoxetine (PROZAC) 40 MG capsule Take 40 mg by mouth every morning.     losartan-hydrochlorothiazide (HYZAAR) 100-25 MG tablet Take 1 tablet by mouth daily.     ondansetron (ZOFRAN-ODT) 4 MG disintegrating tablet Take 1 tablet (4 mg total) by mouth every 8 (eight) hours as needed for nausea or vomiting. 20 tablet 0   VANISHPOINT SAFETY SYRINGE 25G X 1" 3 ML MISC See admin instructions.      No facility-administered medications prior to visit.    PAST MEDICAL HISTORY: Past Medical History:  Diagnosis Date   Hypertension     PAST SURGICAL HISTORY: No past surgical history on file.  FAMILY HISTORY: Family History  Problem  Relation Age of Onset   Cancer Mother 44       colon   Alcohol abuse Father    Diabetes Father    Drug abuse Brother    Stroke Brother        from vegetation due to iv drug use   Cancer Maternal Grandmother        lymphoma   Cancer Paternal Grandmother        breast   Stroke Paternal Grandmother    Early death Neg Hx    Hyperlipidemia Neg Hx    Heart disease Neg Hx     SOCIAL HISTORY: Social History   Socioeconomic History   Marital status: Married    Spouse name: Not on file   Number of children: Not on file   Years of education: Not on file   Highest education level: Not on file  Occupational History    Comment: [employer]  Tobacco Use   Smoking status: Never   Smokeless  tobacco: Never  Vaping Use   Vaping Use: Never used  Substance and Sexual Activity   Alcohol use: No   Drug use: No   Sexual activity: Not on file  Other Topics Concern   Not on file  Social History Narrative   Not on file   Social Determinants of Health   Financial Resource Strain: Not on file  Food Insecurity: Not on file  Transportation Needs: Not on file  Physical Activity: Not on file  Stress: Not on file  Social Connections: Not on file  Intimate Partner Violence: Not on file     PHYSICAL EXAM  There were no vitals filed for this visit.  There is no height or weight on file to calculate BMI.  Generalized: Well developed, in no acute distress  Cardiology: normal rate and rhythm, no murmur noted Respiratory: clear to auscultation bilaterally  Neurological examination  Mentation: Alert oriented to time, place, history taking. Follows all commands speech and language fluent Cranial nerve II-XII: Pupils were equal round reactive to light. Extraocular movements were full, visual field were full  Motor: The motor testing reveals 5 over 5 strength of all 4 extremities. Good symmetric motor tone is noted throughout.  Gait and station: Gait is normal.    DIAGNOSTIC DATA (LABS, IMAGING, TESTING) - I reviewed patient records, labs, notes, testing and imaging myself where available.      No data to display           Lab Results  Component Value Date   WBC 8.8 02/05/2022   HGB 13.8 02/05/2022   HCT 39.8 02/05/2022   MCV 84.3 02/05/2022   PLT 260 02/05/2022      Component Value Date/Time   NA 137 02/05/2022 0715   NA 144 11/10/2017 0834   K 2.9 (L) 02/05/2022 0715   CL 100 02/05/2022 0715   CO2 25 02/05/2022 0715   GLUCOSE 116 (H) 02/05/2022 0715   BUN 12 02/05/2022 0715   BUN 10 11/10/2017 0834   CREATININE 1.14 02/05/2022 0715   CALCIUM 8.9 02/05/2022 0715   PROT 7.2 02/05/2022 0715   PROT 6.6 11/10/2017 0834   ALBUMIN 3.9 02/05/2022 0715   ALBUMIN  4.5 11/10/2017 0834   AST 26 02/05/2022 0715   ALT 31 02/05/2022 0715   ALKPHOS 85 02/05/2022 0715   BILITOT 0.7 02/05/2022 0715   BILITOT 0.3 11/10/2017 0834   GFRNONAA >60 02/05/2022 0715   GFRAA >60 06/10/2020 0410   Lab Results  Component Value Date   CHOL 211 (H) 11/10/2017   HDL 43 11/10/2017   LDLCALC 147 (H) 11/10/2017   TRIG 107 11/10/2017   CHOLHDL 4.9 11/10/2017   Lab Results  Component Value Date   HGBA1C 5.0 04/27/2017   No results found for: "VITAMINB12" Lab Results  Component Value Date   TSH 1.100 11/10/2017     ASSESSMENT AND PLAN 36 y.o. year old male  has a past medical history of Hypertension. here with   No diagnosis found.    Drenda Freeze is doing well on CPAP therapy. Compliance report reveals excellent daily and acceptable 4 hour compliance. He was encouraged to continue using CPAP nightly and for greater than 4 hours each night. We have had a lengthy conversation regarding his concerns of EDS. Armodafinil 250mg  daily does help but he continues to feel that excessive sleepiness negatively effects his lifestyle. I have reviewed previous sleep study and labs. 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 He would like to consider weaning these medications to pursue testing. He will continue armodafinil 250mg  daily for now. We will update supply orders as indicated. Risks of untreated sleep apnea review and education materials provided. Healthy lifestyle habits encouraged. He will follow up in 4-6 months with Dr to discuss concerns of narcolepsy and testing requirements. He verbalizes understanding and agreement with this plan.    No orders of the defined types were placed in this encounter.     No orders of the defined types were placed in this encounter.    , FNP-C 06/16/2022, 4:49 PM [facility] [address], [address] [place], [address] [place] [phone]

## 2022-06-16 NOTE — Patient Instructions (Incomplete)
Please continue using your CPAP regularly. While your insurance requires that you use CPAP at least 4 hours each night on 70% of the nights, I recommend, that you not skip any nights and use it throughout the night if you can. Getting used to CPAP and staying with the treatment long term does take time and patience and discipline. Untreated obstructive sleep apnea when it is moderate to severe can have an adverse impact on cardiovascular health and raise her risk for heart disease, arrhythmias, hypertension, congestive heart failure, stroke and diabetes. Untreated obstructive sleep apnea causes sleep disruption, nonrestorative sleep, and sleep deprivation. This can have an impact on your day to day functioning and cause daytime sleepiness and impairment of cognitive function, memory loss, mood disturbance, and problems focussing. Using CPAP regularly can improve these symptoms.  Please continue working on healthy lifestyle habits. Talk with psychiatry this week regarding anxiety and depression. Continue armodafinil daily.   Follow up in 1 year, sooner if needed

## 2022-06-17 ENCOUNTER — Encounter: Payer: Self-pay | Admitting: Family Medicine

## 2022-06-17 ENCOUNTER — Ambulatory Visit (INDEPENDENT_AMBULATORY_CARE_PROVIDER_SITE_OTHER): Payer: 59 | Admitting: Family Medicine

## 2022-06-17 VITALS — BP 141/87 | HR 93 | Ht 71.0 in | Wt 283.0 lb

## 2022-06-17 DIAGNOSIS — Z9989 Dependence on other enabling machines and devices: Secondary | ICD-10-CM

## 2022-06-17 DIAGNOSIS — G4719 Other hypersomnia: Secondary | ICD-10-CM | POA: Diagnosis not present

## 2022-06-17 DIAGNOSIS — G4733 Obstructive sleep apnea (adult) (pediatric): Secondary | ICD-10-CM | POA: Diagnosis not present

## 2022-06-18 ENCOUNTER — Other Ambulatory Visit: Payer: Self-pay | Admitting: *Deleted

## 2022-06-18 DIAGNOSIS — G4733 Obstructive sleep apnea (adult) (pediatric): Secondary | ICD-10-CM

## 2022-06-18 DIAGNOSIS — G4719 Other hypersomnia: Secondary | ICD-10-CM

## 2022-06-18 DIAGNOSIS — G4710 Hypersomnia, unspecified: Secondary | ICD-10-CM

## 2022-06-18 MED ORDER — ARMODAFINIL 250 MG PO TABS: 250.0000 mg | ORAL_TABLET | Freq: Every day | ORAL | 5 refills | Status: DC

## 2022-06-18 NOTE — Addendum Note (Signed)
Addended by: Arther Abbott on: 06/18/2022 12:58 PM   Modules accepted: Orders

## 2022-06-18 NOTE — Addendum Note (Signed)
Addended byNicholas Lose, Malaka Ruffner L on: 06/18/2022 01:22 PM   Modules accepted: Orders

## 2022-07-16 ENCOUNTER — Telehealth: Payer: Self-pay

## 2022-07-16 NOTE — Telephone Encounter (Signed)
LVM for pt to call back to schedule sleep study.  

## 2022-09-08 ENCOUNTER — Encounter: Payer: Self-pay | Admitting: Family Medicine

## 2022-09-09 NOTE — Telephone Encounter (Signed)
PA submitted on  [facility] [facility] KEY: [id number] Approved immediately 08/10/22-09/09/2023  Case Id:[id number];Status:Approved;Review Type:Prior Auth;Coverage Start Date:08/10/2022;Coverage End Date:09/09/2023;

## 2022-11-13 IMAGING — DX DG CHEST 1V PORT
1 series · 1 of 1 positions shown · non-contrast
Comparison: None.

CLINICAL DATA: Cough and fever

EXAM:
PORTABLE CHEST 1 VIEW

[chest ap]
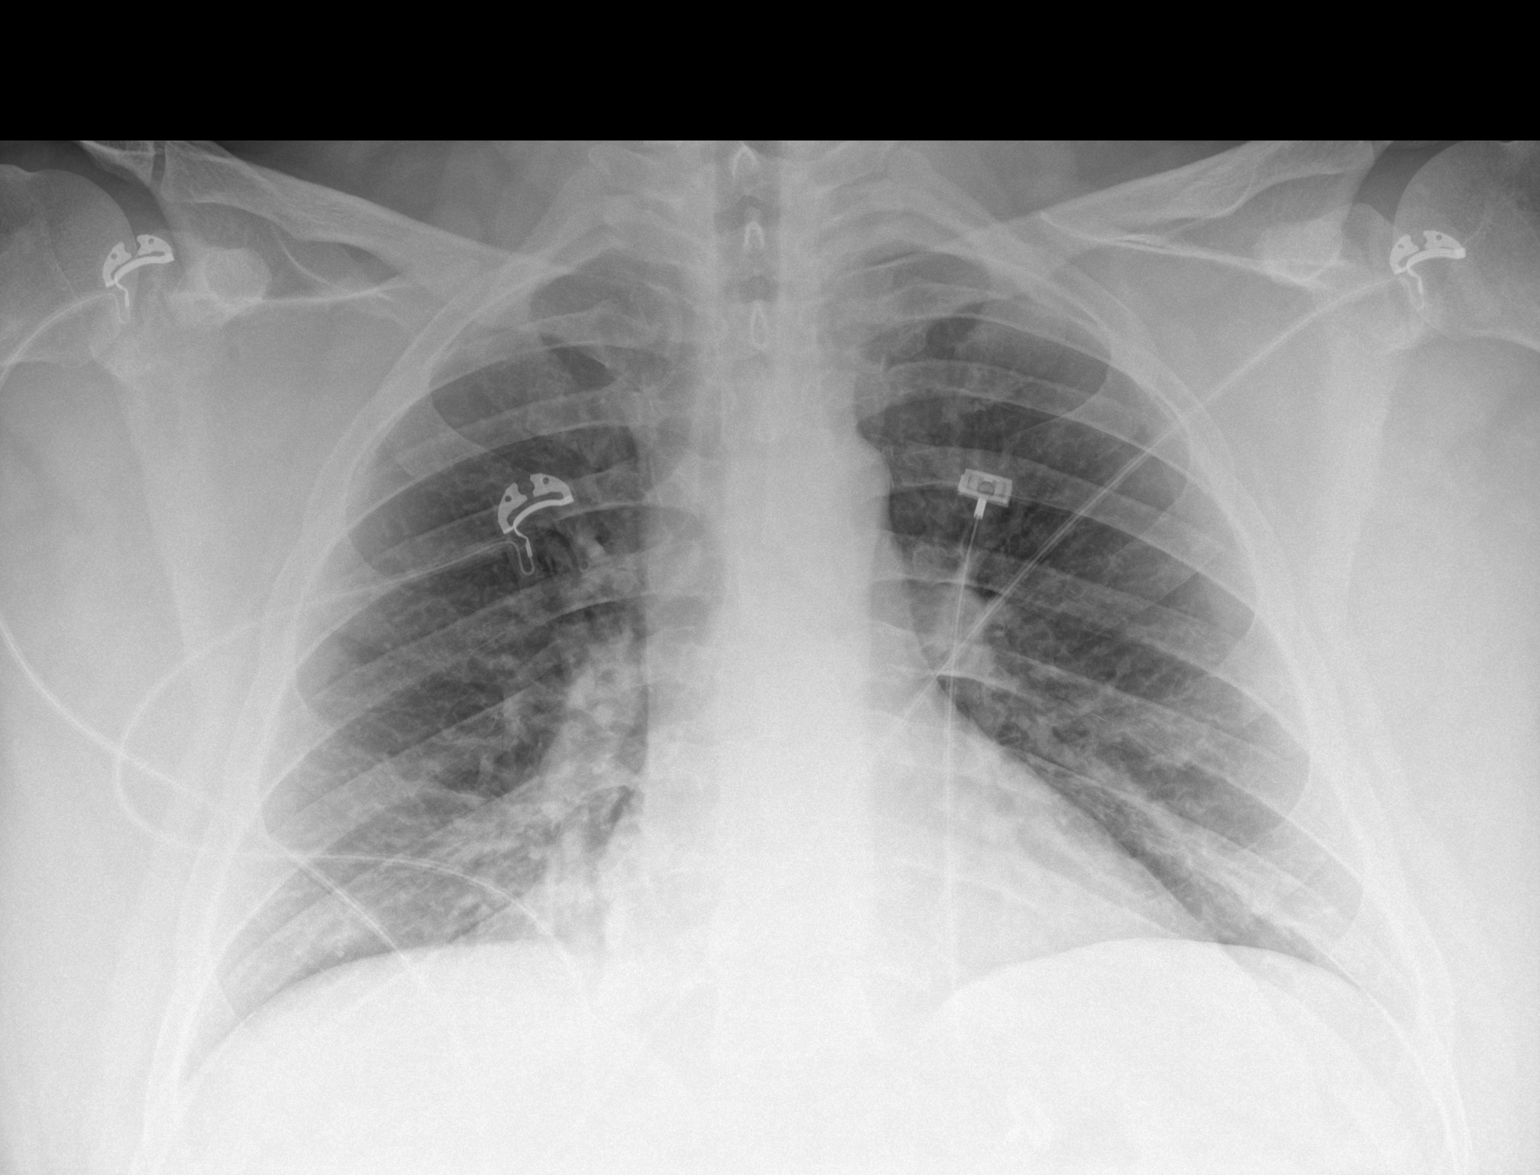

[1 of 1 positions shown; findings below may reference images not displayed]

FINDINGS: Low volume chest which accounts for increased density at the bases.
No edema, effusion, or pneumothorax. Normal heart size and
mediastinal contours.
IMPRESSION: Increased markings at the bases, likely atelectasis from low lung
volumes. Cannot exclude early bronchopneumonia in the setting of
fever.

## 2023-01-11 ENCOUNTER — Other Ambulatory Visit: Payer: Self-pay | Admitting: *Deleted

## 2023-01-11 NOTE — Telephone Encounter (Signed)
Last seen 06/17/22 and next f/u 06/21/23. Per drug registry, last refilled 12/09/22 #30.

## 2023-01-12 MED ORDER — ARMODAFINIL 250 MG PO TABS: 250.0000 mg | ORAL_TABLET | Freq: Every day | ORAL | 5 refills | Status: DC

## 2023-01-13 ENCOUNTER — Encounter: Payer: Self-pay | Admitting: Family Medicine

## 2023-06-17 NOTE — Patient Instructions (Incomplete)
Please continue using your CPAP regularly. While your insurance requires that you use CPAP at least 4 hours each night on 70% of the nights, I recommend, that you not skip any nights and use it throughout the night if you can. Getting used to CPAP and staying with the treatment long term does take time and patience and discipline. Untreated obstructive sleep apnea when it is moderate to severe can have an adverse impact on cardiovascular health and raise her risk for heart disease, arrhythmias, hypertension, congestive heart failure, stroke and diabetes. Untreated obstructive sleep apnea causes sleep disruption, nonrestorative sleep, and sleep deprivation. This can have an impact on your day to day functioning and cause daytime sleepiness and impairment of cognitive function, memory loss, mood disturbance, and problems focussing. Using CPAP regularly can improve these symptoms.  We will update supply orders, today. We will try to place orders for MSLT. Please call if you do not hear back to schedule in the next 2 weeks.   Follow up pending sleep study result

## 2023-06-17 NOTE — Progress Notes (Unsigned)
PATIENT: Sean Wolf DOB: 1986-06-28  REASON FOR VISIT: follow up HISTORY FROM: patient  No chief complaint on file.   HISTORY OF PRESENT ILLNESS:  06/17/23 ALL:  Sean Wolf returns for follow up for sleep apnea on CPAp and EDS. He was last seen 05/2022. MSLT ordered as he had been off antidepressants. He never scheduled testing. Since,   He continues armodafinil 250mg  daily.   06/17/2022 ALL: Sean Wolf returns for follow up for sleep apnea on CPAP with EDS on armodafinil 250mg  daily. He continues to do well with therapy. He is using CPAP every night for about 7-8 hours a night. He denies concerns with machine or supplies.   He continues to have difficulty with daytime sleepiness. He has new concerns of difficulty focusing. He reports that it is hard to stay alert and falls asleep if he is inactive for any period of time. He admits that weight loss efforts have been more difficulty, recently. He has trouble with low motivation. He has a history of anxiety and depression. He has not taken Prozac or Buspar in about 4-5 months. He has an appt with psychiatry this week to discuss. He is concerned he may need to discuss narcolepsy testing with Dr Frances Furbish prior to restarting mood management medications. 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 MSLT not performed at that time as he was being treated with SSRIs. He denies cataplectic events. He is sleeping well at night. He takes armodafinil 250mg  daily but feels it is not as effective as it used to be. He does feel it works better than [employer]. He has never tried modafinil.      07/28/2021 ALL: Sean Wolf is a 37 y.o. male here today for follow up for OSA on CPAP and EDS. He continues to use CPAP most every night. There have been a few nights where he falls short of meeting 4 hours but usually uses therapy for about 6 hours every night. He continues Armodafinil 250mg  daily. He does feel that it helps with EDS. He feels his best  after getting up and taking medication. He feels alert for 3-4 hours then feels he could "pass out asleep" at any time. He has had a hard time getting this medication form the pharmacy. Last filled 07/18/2021. Medication is on backorder. He continues to battle fatigue. He is considering consult with bariatric surgery to help with weight loss. He admits that he could work on healthier lifestyle habits. He is not exercising.     02/04/21 ALL:   He returns today for follow up for OSA on CPAP and EDS on armodafinil 150mg  daily. He is doing well on CPAP therapy. [place] not covered by [employer]. He has tolerated armodafinil and usually takes around 8am. He feels that it works ok but wears off around noon. He feels that he would like to try a higher dose. He is working from home. BP has been good at home. Usual readings are 115-120/80's. He is seeing PCP regularly.    Compliance report dated 01/04/2021 through 02/02/2021 reveals that he used CPAP 29 of the past 30 days for compliance of 97%.  He used CPAP greater than 4 hours 28 of the past 30 days for compliance of 93%.  Average usage on days used was 7 hours and 51 minutes.  Residual AHI was 2.5 on a set pressure of 9 cm of water and EPR of 2.  There was no significant leak noted.   REVIEW OF SYSTEMS:  Out of a complete 14 system review of symptoms, the patient complains only of the following symptoms, fatigue, daytime sleepiness, inattention, anxiety, depression, and all other reviewed systems are negative.  ESS: 21/24, previously 20/24 FSS: not completed today, previously 63  ALLERGIES: No Known Allergies  HOME MEDICATIONS: Outpatient Medications Prior to Visit  Medication Sig Dispense Refill   amLODipine (NORVASC) 10 MG tablet Take 10 mg by mouth daily.     APRETUDE 600 MG/3ML injection Inject into the muscle every 2 (two) months.     Armodafinil 250 MG tablet Take 1 tablet (250 mg total) by mouth daily. 30 tablet 5   losartan-hydrochlorothiazide  (HYZAAR) 100-25 MG tablet Take 1 tablet by mouth daily.     No facility-administered medications prior to visit.    PAST MEDICAL HISTORY: Past Medical History:  Diagnosis Date   Hypertension     PAST SURGICAL HISTORY: No past surgical history on file.  FAMILY HISTORY: Family History  Problem Relation Age of Onset   Cancer Mother 14       colon   Alcohol abuse Father    Diabetes Father    Drug abuse Brother    Stroke Brother        from vegetation due to iv drug use   Cancer Maternal Grandmother        lymphoma   Cancer Paternal Grandmother        breast   Stroke Paternal Grandmother    Early death Neg Hx    Hyperlipidemia Neg Hx    Heart disease Neg Hx     SOCIAL HISTORY: Social History   Socioeconomic History   Marital status: Married    Spouse name: Not on file   Number of children: Not on file   Years of education: Not on file   Highest education level: Not on file  Occupational History    Comment: [employer]  Tobacco Use   Smoking status: Never   Smokeless tobacco: Never  Vaping Use   Vaping status: Never Used  Substance and Sexual Activity   Alcohol use: No   Drug use: No   Sexual activity: Not on file  Other Topics Concern   Not on file  Social History Narrative   Not on file   Social Determinants of Health   Financial Resource Strain: Low Risk  (09/26/2020)   Received from [facility]   Overall Financial Resource Strain (CARDIA)  Food Insecurity: No Food Insecurity (01/25/2023)   Received from [facility]   Hunger Vital Sign    Worried About Running Out of Food in the Last Year: Never true    Ran Out of Food in the Last Year: Never true  Transportation Needs: No Transportation Needs (09/26/2020)   Received from [facility]   PRAPARE - Transportation  Physical Activity: Inactive (09/26/2020)   Received from [facility]   Exercise Vital Sign  Stress: Stress Concern  Present (09/26/2020)   Received from [facility] of Occupational Health - Occupational Stress Questionnaire  Social Connections: Moderately Isolated (09/26/2020)   Received from [facility]   Social Connection and Isolation Panel [NHANES]  Intimate Partner Violence: Not on file     PHYSICAL EXAM  There were no vitals filed for this visit.   There is no height or weight on file to calculate BMI.  Generalized: Well developed, in no acute distress  Cardiology: normal rate  and rhythm, no murmur noted Respiratory: clear to auscultation bilaterally  Neurological examination  Mentation: Alert oriented to time, place, history taking. Follows all commands speech and language fluent Cranial nerve II-XII: Pupils were equal round reactive to light. Extraocular movements were full, visual field were full  Motor: The motor testing reveals 5 over 5 strength of all 4 extremities. Good symmetric motor tone is noted throughout.  Gait and station: Gait is normal.    DIAGNOSTIC DATA (LABS, IMAGING, TESTING) - I reviewed patient records, labs, notes, testing and imaging myself where available.      No data to display           Lab Results  Component Value Date   WBC 8.8 02/05/2022   HGB 13.8 02/05/2022   HCT 39.8 02/05/2022   MCV 84.3 02/05/2022   PLT 260 02/05/2022      Component Value Date/Time   NA 137 02/05/2022 0715   NA 144 11/10/2017 0834   K 2.9 (L) 02/05/2022 0715   CL 100 02/05/2022 0715   CO2 25 02/05/2022 0715   GLUCOSE 116 (H) 02/05/2022 0715   BUN 12 02/05/2022 0715   BUN 10 11/10/2017 0834   CREATININE 1.14 02/05/2022 0715   CALCIUM 8.9 02/05/2022 0715   PROT 7.2 02/05/2022 0715   PROT 6.6 11/10/2017 0834   ALBUMIN 3.9 02/05/2022 0715   ALBUMIN 4.5 11/10/2017 0834   AST 26 02/05/2022 0715   ALT 31 02/05/2022 0715   ALKPHOS 85 02/05/2022 0715   BILITOT 0.7 02/05/2022 0715   BILITOT 0.3 11/10/2017 0834    GFRNONAA >60 02/05/2022 0715   GFRAA >60 06/10/2020 0410   Lab Results  Component Value Date   CHOL 211 (H) 11/10/2017   HDL 43 11/10/2017   LDLCALC 147 (H) 11/10/2017   TRIG 107 11/10/2017   CHOLHDL 4.9 11/10/2017   Lab Results  Component Value Date   HGBA1C 5.0 04/27/2017   No results found for: "VITAMINB12" Lab Results  Component Value Date   TSH 1.100 11/10/2017     ASSESSMENT AND PLAN 37 y.o. year old male  has a past medical history of Hypertension. here with   No diagnosis found.   Drenda Freeze is doing well on CPAP therapy. Compliance report reveals excellent daily and acceptable 4 hour compliance. He was encouraged to continue using CPAP nightly and for greater than 4 hours each night. Armodafinil 250mg  daily does help but he continues to feel that excessive sleepiness negatively effects his lifestyle. I have reviewed previous sleep study and labs. 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 He stopped these medications about 3 months ago. He admits that anxiety and depression have worsened and has an appt with psychiatry this week to discuss but wishes to consider MSLT prior to resuming mood management meds. He will continue armodafinil 250mg  daily for now. We will update supply orders as indicated. Risks of untreated sleep apnea review and education materials provided. Healthy lifestyle habits encouraged. I will discuss case with Dr Frances Furbish and schedule follow up with her as appropriate.  He verbalizes understanding and agreement with this plan.    No orders of the defined types were placed in this encounter.     No orders of the defined types were placed in this encounter.   I spent 30 minutes of face-to-face and non-face-to-face time with patient.  This included previsit chart review, lab review, study review, order entry,  electronic health record documentation, patient education.   Shawnie Dapper, FNP-C  06/17/2023, 2:07 PM [facility] [address] [place], [place] [address] [phone]

## 2023-06-21 ENCOUNTER — Ambulatory Visit (INDEPENDENT_AMBULATORY_CARE_PROVIDER_SITE_OTHER): Payer: 59 | Admitting: Family Medicine

## 2023-06-21 ENCOUNTER — Encounter: Payer: Self-pay | Admitting: Family Medicine

## 2023-06-21 VITALS — BP 136/84 | HR 92 | Ht 71.0 in | Wt 286.5 lb

## 2023-06-21 DIAGNOSIS — G4733 Obstructive sleep apnea (adult) (pediatric): Secondary | ICD-10-CM | POA: Diagnosis not present

## 2023-06-21 DIAGNOSIS — G4730 Sleep apnea, unspecified: Secondary | ICD-10-CM | POA: Diagnosis not present

## 2023-06-21 DIAGNOSIS — G4710 Hypersomnia, unspecified: Secondary | ICD-10-CM | POA: Diagnosis not present

## 2023-06-21 DIAGNOSIS — G4719 Other hypersomnia: Secondary | ICD-10-CM

## 2023-06-21 MED ORDER — ARMODAFINIL 250 MG PO TABS: 250.0000 mg | ORAL_TABLET | Freq: Every day | ORAL | 1 refills | Status: DC

## 2023-07-16 ENCOUNTER — Encounter: Payer: Self-pay | Admitting: Family Medicine

## 2023-07-16 DIAGNOSIS — G4733 Obstructive sleep apnea (adult) (pediatric): Secondary | ICD-10-CM

## 2023-07-16 DIAGNOSIS — G4719 Other hypersomnia: Secondary | ICD-10-CM

## 2023-07-22 ENCOUNTER — Telehealth: Payer: Self-pay | Admitting: Family Medicine

## 2023-07-22 NOTE — Telephone Encounter (Signed)
NPSG/MSLT [employer] no auth req via auto machine ref # [id number]

## 2023-08-12 NOTE — Telephone Encounter (Signed)
I called the patient he did not pick up and I left him a voicemail asking for him to call back again to schedule his sleep studies.

## 2023-08-26 NOTE — Telephone Encounter (Signed)
Called pt and he stated that he spoke with someone from his job over [employer] and they told him that since he is unable to sleep even with the medication he would need to have his paperwork say to have him out from today 08/26/2023 until his sleep study on 10/17/23-10/18/23. Told pt we will ask amy about that because we were under the notion that we were only doing the time for pt to work 4 hours everyday per week per pt's work schedule of Monday-Friday for the 2 weeks leading up to his Sleep Study. Told pt we will call him back.

## 2023-08-31 ENCOUNTER — Telehealth: Payer: Self-pay

## 2023-08-31 NOTE — Telephone Encounter (Signed)
Please see Mychart for today 08/31/2023

## 2023-09-24 ENCOUNTER — Telehealth: Payer: Self-pay

## 2023-09-24 ENCOUNTER — Encounter: Payer: Self-pay | Admitting: Family Medicine

## 2023-09-24 ENCOUNTER — Telehealth: Payer: Self-pay | Admitting: *Deleted

## 2023-09-24 ENCOUNTER — Other Ambulatory Visit (HOSPITAL_COMMUNITY): Payer: Self-pay

## 2023-09-24 NOTE — Telephone Encounter (Signed)
Pt sent mychart message that PA armodafinil needed

## 2023-09-24 NOTE — Telephone Encounter (Signed)
PA request has been Approved. New Encounter created for follow up. For additional info see Pharmacy Prior Auth telephone encounter from 09/24/2023.

## 2023-09-24 NOTE — Telephone Encounter (Addendum)
Pharmacy Patient Advocate Encounter  Received notification from [facility] that Prior Authorization for Armodafinil 250MG  tablets has been APPROVED from 08/25/2023 to 09/23/2024. Ran test claim, Copay is $15.00. This test claim was processed through [facility]- copay amounts may vary at other pharmacies due to pharmacy/plan contracts, or as the patient moves through the different stages of their insurance plan.   PA #/Case ID/Reference #: PA Case ID #: [id number]  Key: [id number]

## 2023-09-27 NOTE — Telephone Encounter (Signed)
Sent mychart message

## 2023-10-17 ENCOUNTER — Ambulatory Visit (INDEPENDENT_AMBULATORY_CARE_PROVIDER_SITE_OTHER): Payer: 59 | Admitting: Neurology

## 2023-10-17 DIAGNOSIS — G4719 Other hypersomnia: Secondary | ICD-10-CM

## 2023-10-17 DIAGNOSIS — G4733 Obstructive sleep apnea (adult) (pediatric): Secondary | ICD-10-CM

## 2023-10-17 DIAGNOSIS — G4720 Circadian rhythm sleep disorder, unspecified type: Secondary | ICD-10-CM

## 2023-10-19 NOTE — Procedures (Unsigned)
Physician Interpretation:    Referred by: Dione Housekeeper, MD    History and Indication for Testing: 37 year old male with an underlying medical history of hypertension, OSA on CPAP, and obesity, who presents for an overnight sleep study with CPAP and possible next day MSLT to evaluate his significant daytime somnolence despite compliance with PAP therapy. He stopped his armodafinil in preparation for testing. He did not qualify for the next day MSLT, due to CPAP being titrated, more than one level.    TITRATION DETAILS (SEE ALSO TABLE AT THE END OF THE REPORT):  The patient was fitted with a medium N20 nasal mask from [employer] (which is his home mask).  The patient was started on a pressure of 9 cm of water pressure (which is his home pressure) and gradually titrated to a final titration pressure of 14. The AHI was improved and optimized at a pressure of 14 cm, on which the patient achieved a total sleep time of  41.5 minutes.  Residual AHI was ***, O2 nadir of ***, with ***sleep achieved.    EEG: Review of the EEG showed no abnormal electrical discharges and symmetrical bihemispheric findings.     EKG: The EKG revealed normal sinus rhythm (NSR). ***   AUDIO/VIDEO REVIEW: The audio and video review did not show any abnormal or unusual behaviors, movements, phonations or vocalizations. The patient took no restroom breaks. Snoring was noted, and was mild during the study, improved with titration.   POST-STUDY QUESTIONNAIRE: Post study, the patient indicated, that sleep was worse than usual.    IMPRESSION:   Obstructive Sleep Apnea (OSA) Dysfunctions associated with sleep stages or arousal from sleep   RECOMMENDATIONS:        I certify that I have reviewed the entire raw data recording prior to the issuance of this report in accordance with the Standards of Accreditation of the American Academy of Sleep Medicine (AASM).   Huston Foley, MD, PhD Medical Director, [facility]) Diplomat, ABPN (Neurology and Sleep)   Technical Report:   [facility] CPAP Summary    General Information  Name: Sean Wolf, Sean Wolf BMI: 16.10 Physician: Huston Foley, MD  ID: [id number] Height: 71.0 in Technician:  Margaretann Loveless, RPSGT  Sex: Male Weight: 286.0 lb Record: [id number]  Age: 37 [[birth year]] Date: 10/17/2023     Medical & Medication History    06/21/23 ALL: Sean Wolf returns for follow up for sleep apnea on CPAp and EDS. He was last seen 05/2022. MSLT ordered as he had been off antidepressants. He never scheduled testing. Since, he reports doing about the same. No significant changes. He continues armodafinil 250mg  daily. He continues to have EDS event with taking medication and notices significant worsening if he misses a dose. He recently started a job with [employer] and is much more active but doesn't feel it has made a significant difference in sleepiness. He is fully compliant with CPAP therapy. Using therapy for 8-9 hours every night. AHI well managed. The total APNEA/HYPOPNEA INDEX (AHI) was 19.8/hour and the total RESPIRATORY DISTURBANCE INDEX was 0. 19.8 /hour. 114 events occurred in REM sleep and 28 events in NREM. The REM AHI was 53.6 /hour, versus a non-REM AHI of 3.8. The patient spent 304 minutes of total sleep time in the supine position and 94 minutes in non-supine.. The supine AHI was 21.7/hour versus a non-supine AHI of 13.4.  Norvasc, Apretude, Hyzaar, Armodafinil   Sleep Disorder  Comments   The patient came into the lab for a CPAP/MSLT. The patient was not able to complete MSLT due to CPAP pressure being increased multiple times. Per the patient he weaned off Armodafinil. Prior sleep studies on 07/19/18 and 08/12/18 at our sleep lab. The patient was fitted with a [employer] N20 (Nasal) size Med. This is the interface the patient uses at home. The patient tolerated the mask well. CPAP was started at  [facility] with EPR of 2. This is the patient's home CPAP setting. CPAP was increased to 14cmH2O with EPR of 1. Tech did decrease EPR to 0 to see if it would help patient's events in REM while supine. The patient had no restroom breaks. No obvious cardiac arrhythmias. Mild snoring. All sleep stages witnessed. Respiratory events scored with a 3% desat. Slept supine and lateral. Respiratory events were during REM while supine. Asked the patient to sleep lateral to see if respiratory events were positional however, the patient did not have REM while lateral.     CPAP start time: 09:45:53 PM CPAP end time: 05:00:48 AM   Time Total Supine Side Prone Upright  Recording (TRT) 7h 15.37m 5h 0.62m 2h 14.71m 0h 0.36m 0h 0.30m  Sleep (TST) 6h 13.59m 4h 7.70m 2h 6.50m 0h 0.80m 0h 0.92m   Latency N1 N2 N3 REM Onset Per. Slp. Eff.  Actual 0h 0.61m 0h 1.97m 0h 17.73m 0h 51.70m 0h 35.69m 0h 35.46m 85.86%   Stg Dur Wake N1 N2 N3 REM  Total 61.5 5.0 152.5 132.0 84.0  Supine 53.5 3.5 74.0 85.5 84.0  Side 8.0 1.5 78.5 46.5 0.0  Prone 0.0 0.0 0.0 0.0 0.0  Upright 0.0 0.0 0.0 0.0 0.0   Stg % Wake N1 N2 N3 REM  Total 14.1 1.3 40.8 35.3 22.5  Supine 12.3 0.9 19.8 22.9 22.5  Side 1.8 0.4 21.0 12.4 0.0  Prone 0.0 0.0 0.0 0.0 0.0  Upright 0.0 0.0 0.0 0.0 0.0     Apnea Summary Sub Supine Side Prone Upright  Total 0 Total 0 0 0 0 0    REM 0 0 0 0 0    NREM 0 0 0 0 0  Obs 0 REM 0 0 0 0 0    NREM 0 0 0 0 0  Mix 0 REM 0 0 0 0 0    NREM 0 0 0 0 0  Cen 0 REM 0 0 0 0 0    NREM 0 0 0 0 0   Rera Summary Sub Supine Side Prone Upright  Total 0 Total 0 0 0 0 0    REM 0 0 0 0 0    NREM 0 0 0 0 0   Hypopnea Summary Sub Supine Side Prone Upright  Total 40 Total 40 40 0 0 0    REM 30 30 0 0 0    NREM 10 10 0 0 0   4% Hypopnea Summary Sub Supine Side Prone Upright  Total (4%) 25 Total 25 25 0 0 0    REM 20 20 0 0 0    NREM 5 5 0 0 0     AHI Total Obs Mix Cen  6.43 Apnea 0.00 0.00 0.00 0.00   Hypopnea 6.43 -- -- --  4.02  Hypopnea (4%) 4.02 -- -- --    Total Supine Side Prone Upright  Position AHI 6.43 9.72 0.00 0.00 0.00  REM AHI 21.43   NREM AHI 2.07   Position RDI 6.43 9.72 0.00  0.00 0.00  REM RDI 21.43   NREM RDI 2.07    4% Hypopnea Total Supine Side Prone Upright  Position AHI (4%) 4.02 6.07 0.00 0.00 0.00  REM AHI (4%) 14.29   NREM AHI (4%) 1.04   Position RDI (4%) 4.02 6.07 0.00 0.00 0.00  REM RDI (4%) 14.29   NREM RDI (4%) 1.04    Desaturation Information  <100% <90% <80% <70% <60% <50% <40%  Supine 49 5 0 0 0 0 0  Side 6 0 0 0 0 0 0  Prone 0 0 0 0 0 0 0  Upright 0 0 0 0 0 0 0  Total 55 5 0 0 0 0 0  Desaturation threshold setting: 3% Minimum desaturation setting: 10 seconds SaO2 nadir: 76% The longest event was a 52 sec obstructive Hypopnea with a minimum SaO2 of 92%. The lowest SaO2 was 87% associated with a 15 sec obstructive Hypopnea. EKG Rates EKG Avg Max Min  Awake 86 104 66  Asleep 78 99 64  EKG Events: Tachycardia Awakening/Arousal Information # of Awakenings 17  Wake after sleep onset 26.55m  Wake after persistent sleep 26.21m   Arousal Assoc. Arousals Index  Apneas 0 0.0  Hypopneas 5 0.8  Leg Movements 0 0.0  Snore 0.0 0.0  PTT Arousals 0 0.0  Spontaneous 60 9.6  Total 65 10.4  Myoclonus Information PLMS LMs Index  Total LMs during PLMS 0 0.0  LMs w/ Microarousals 0 0.0   LM LMs Index  w/ Microarousal 0 0.0  w/ Awakening 0 0.0  w/ Resp Event 0 0.0  Spontaneous 2 0.3  Total 2 0.3      Titration Table:  [facility] CPAP/Bilevel Report    General Information  Name: Sean Wolf, Sean Wolf BMI: 39 Physician: Huston Foley  ID: [id number] Height: 71 in Technician: Margaretann Loveless  Sex: Male Weight: 286 lb Record: [id number]  Age: 47 [Feb 12, 1986] Date: 10/17/2023 Scorer: Zenon Mayo Fields   Recommended Settings IPAP: N/A cmH20 EPAP: N/A cmH2O AHI: N/A AHI (4%): N/A   Pressure IPAP/EPAP 00 09 10 12 14    O2 Vol 0.0 0.0 0.0 0.0 0.0   Time TRT 0.72m 91.73m 197.40m 97.32m 48.71m   TST 0.55m 53.17m 188.63m 91.53m 41.12m  Sleep Stage % Wake 0.0 42.1 4.8 6.7 14.4   % REM 0.0 10.4 18.1 45.6 7.2   % N1 0.0 3.8 0.8 1.1 1.2   % N2 0.0 38.7 31.1 47.3 73.5   % N3 0.0 47.2 50.0 6.0 18.1  Respiratory Total Events 0 8 25 7  0   Obs. Apn. 0 0 0 0 0   Mixed Apn. 0 0 0 0 0   Cen. Apn. 0 0 0 0 0   Hypopneas 0 8 25 7  0   AHI 0.00 9.06 7.98 4.62 0.00   Supine AHI 0.00 9.06 10.75 8.16 0.00   Prone AHI 0.00 0.00 0.00 0.00 0.00   Side AHI 0.00 0.00 0.00 0.00 0.00  Respiratory (4%) Hypopneas (4%) 0.00 5.00 15.00 5.00 0.00   AHI (4%) 0.00 5.66 4.79 3.30 0.00   Supine AHI (4%) 0.00 5.66 6.45 5.83 0.00   Prone AHI (4%) 0.00 0.00 0.00 0.00 0.00   Side AHI (4%) 0.00 0.00 0.00 0.00 0.00  Desat Profile <= 90% 0.71m 0.13m 0.93m 0.89m 0.16m   <= 80% 0.2m 0.22m 0.81m 0.69m 0.34m   <= 70% 0.70m 0.83m 0.48m 0.86m 0.7m   <= 60% 0.50m 0.33m 0.37m 0.8m 0.44m  Arousal Index Apnea 0.0 0.0 0.0 0.0 0.0   Hypopnea 0.0 0.0 1.6 0.0 0.0   LM 0.0 0.0 0.0 0.0 0.0   Spontaneous 0.0 14.7 11.5 4.6 5.8

## 2023-10-20 NOTE — Addendum Note (Signed)
Addended by: Huston Foley on: 10/20/2023 05:02 PM   Modules accepted: Orders

## 2023-10-26 ENCOUNTER — Telehealth: Payer: Self-pay | Admitting: *Deleted

## 2023-10-26 NOTE — Telephone Encounter (Signed)
Called and spoke w/ pt Relayed results per Dr. Frances Furbish note. Pt verbalized understanding. However, he expressed dissatisfaction because he said no one called to tell him what to expect for sleep study, did not receive anything in the mail. He also was not told what to bring, what time to be there. He did not know to bring his mask from home. He feels this impacted the study. He is well controlled at home at setting of 9cm using his own mask. He wants to proceed with MSLT. He went off meds for 2 weeks and this was difficult for him to do.   He wants appt with Dr. Frances Furbish to discus things. Aware she is booking out several months. Ok for me to talk with her and call back with a plan.  I spoke with Meagan Allen/sleep Personal assistant. Mychart message sent 08/12/23 with sleep study details. He read on 08/18/23. Emily scheduled pt. Should also get automated call and mychart reminderl since he's signed up for mychart.   I called pt back. Relayed above. Also explained since he was treated well at 14cm night of sleep study, he will need to try this first and then f/u with out office to have MD review how things are going on new setting. If he is not treated effectively, MSLT may be covered at that point.   Community message sent to [employer] that new order placed. Appt scheduled with Dr. Frances Furbish 01/04/2024 at 10:45am. Asked him to call back if he feels new setting does not work well. I did let him know we will need at least 7 days consecutive data to review to determine if changes need to be made. He verbalized understanding.

## 2023-10-26 NOTE — Telephone Encounter (Signed)
-----   Message from Nurse Toma Copier C sent at 10/21/2023 11:45 AM EST -----  ----- Message ----- From: Huston Foley, MD Sent: 10/20/2023   5:02 PM EST To: Gna-[facility] 4 Results  Patient presented for an overnight CPAP study with potential next day nap testing.  He did not stay for the MSLT next day as the CPAP was titrated overnight by more than 1 level.  He has been on CPAP of 9 cm at home but optimized during the sleep study to 14 cm.  I would like for him to start using CPAP at 14 cm at home.  We can keep his home mask the same.  Please advise patient to follow-up in our sleep clinic to see Amy in about 6 months.  He can resume his armodafinil.

## 2023-12-20 ENCOUNTER — Encounter: Payer: Self-pay | Admitting: Family Medicine

## 2023-12-20 ENCOUNTER — Other Ambulatory Visit: Payer: Self-pay

## 2023-12-20 NOTE — Telephone Encounter (Signed)
   Expired on 12/18/23  Last appointment: 06/21/2023 Next appointment: 01/04/2024

## 2023-12-21 MED ORDER — ARMODAFINIL 250 MG PO TABS: 250.0000 mg | ORAL_TABLET | Freq: Every day | ORAL | 1 refills | Status: DC

## 2024-01-04 ENCOUNTER — Ambulatory Visit (INDEPENDENT_AMBULATORY_CARE_PROVIDER_SITE_OTHER): Payer: 59 | Admitting: Neurology

## 2024-01-04 VITALS — BP 147/88 | HR 82 | Ht 71.0 in | Wt 297.0 lb

## 2024-01-04 DIAGNOSIS — G4733 Obstructive sleep apnea (adult) (pediatric): Secondary | ICD-10-CM | POA: Diagnosis not present

## 2024-01-04 DIAGNOSIS — G4710 Hypersomnia, unspecified: Secondary | ICD-10-CM

## 2024-01-04 DIAGNOSIS — G4730 Sleep apnea, unspecified: Secondary | ICD-10-CM

## 2024-01-04 MED ORDER — MODAFINIL 200 MG PO TABS: 200.0000 mg | ORAL_TABLET | Freq: Two times a day (BID) | ORAL | 3 refills | Status: DC

## 2024-01-04 NOTE — Progress Notes (Signed)
 Subjective:    Patient ID: Sean Wolf is a 38 y.o. male.  HPI    Interim history:    Mr. Sean Wolf is a 38year-old right-handed gentleman with an underlying medical history of hypertension and obesity, who presents for follow-up consultation of his hypersomnolence with sleep apnea, after interim sleep testing.  He was last seen in this clinic by Greig Forbes, NP on 06/21/2023, at which time a CPAP study with next day MSLT was ordered due to residual daytime somnolence despite taking armodafinil . He had a titration study on 10/17/2023, at which time he was started with a CPAP of 9 cm and titrated to a final titration pressure of 14 cm.  He did not have his next-day nap test due to titration of CPAP of more than 1 level.  He was advised to start home CPAP therapy at a pressure of 14 cm.  His REM latency was mildly below normal at 51 minutes, REM percentage normal at 22%.  Today, 01/04/2024: I reviewed his CPAP compliance data from 12/03/2023 through 08/30/2024, which is a total of 30 days, during which time he used his machine every night with percent use days greater than 4 hours at 100%, indicating superb compliance with an average usage of 8 hours and 29 minutes, residual AHI at goal at 0.6/h, pressure of 14 cm with EPR of 2.  Air leak on the higher side with the 95th percentile at 25.2 L/min.  He reports using a fullface mask for the past 2 weeks which feels a little better, his sleep has indeed improved in the past 10 to 14 days.  He is frustrated that he could not stay for the nap study to investigate his daytime somnolence and perhaps provide him with an additional diagnosis as he still is very sleepy during the day despite taking armodafinil  250 mg once daily in the morning.  Sunosi  in the past did not help that much, he is not opposed to considering a stimulant. He would even be willing to taper off his Prozac stop his armodafinil  for repeat sleep study and nap study.  He takes the armodafinil  around 7:30  AM.  His Epworth sleepiness score is 22 out of 24 today.  The patient's allergies, current medications, family history, past medical history, past social history, past surgical history and problem list were reviewed and updated as appropriate.    Previously:   06/21/2023 (Amy Lomax, NP): <<Sean Wolf returns for follow up for sleep apnea on CPAp and EDS. He was last seen 05/2022. MSLT ordered as he had been off antidepressants. He never scheduled testing. Since, he reports doing about the same. No significant changes. He continues armodafinil  250mg  daily. He continues to have EDS event with taking medication and notices significant worsening if he misses a dose. He recently started a job with [employer] and is much more active but doesn't feel it has made a significant difference in sleepiness. He is fully compliant with CPAP therapy. Using therapy for 8-9 hours every night. AHI well managed. >>  06/17/2022 (Amy Lomax, NP): <<Sean Wolf returns for follow up for sleep apnea on CPAP with EDS on armodafinil  250mg  daily. He continues to do well with therapy. He is using CPAP every night for about 7-8 hours a night. He denies concerns with machine or supplies.    He continues to have difficulty with daytime sleepiness. He has new concerns of difficulty focusing. He reports that it is hard to stay alert and falls asleep if he is inactive  for any period of time. He admits that weight loss efforts have been more difficulty, recently. He has trouble with low motivation. He has a history of anxiety and depression. He has not taken Prozac or Buspar  in about 4-5 months. He has an appt with psychiatry this week to discuss. He is concerned he may need to discuss narcolepsy testing with Dr Buck prior to restarting mood management medications. 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 MSLT not performed at that time as he was being treated with SSRIs. He denies cataplectic events. He is sleeping well at night. He  takes armodafinil  250mg  daily but feels it is not as effective as it used to be. He does feel it works better than Sunosi . He has never tried modafinil . >>  07/28/2021 (Amy Lomax, NP): <<Sean Wolf is a 38 y.o. male here today for follow up for OSA on CPAP and EDS. He continues to use CPAP most every night. There have been a few nights where he falls short of meeting 4 hours but usually uses therapy for about 6 hours every night. He continues Armodafinil  250mg  daily. He does feel that it helps with EDS. He feels his best after getting up and taking medication. He feels alert for 3-4 hours then feels he could pass out asleep at any time. He has had a hard time getting this medication form the pharmacy. Last filled 07/18/2021. Medication is on backorder. He continues to battle fatigue. He is considering consult with bariatric surgery to help with weight loss. He admits that he could work on healthier lifestyle habits. He is not exercising. >>  02/04/2021 (Sean Wolf, Amy Lomax, NP): <<He returns today for follow up for OSA on CPAP and EDS on armodafinil  150mg  daily. He is doing well on CPAP therapy. Sunosi  not covered by [employer]. He has tolerated armodafinil  and usually takes around 8am. He feels that it works ok but wears off around noon. He feels that he would like to try a higher dose. He is working from home. BP has been good at home. Usual readings are 115-120/80's. He is seeing PCP regularly.    Compliance report dated 01/04/2021 through 02/02/2021 reveals that he used CPAP 29 of the past 30 days for compliance of 97%.  He used CPAP greater than 4 hours 28 of the past 30 days for compliance of 93%.  Average usage on days used was 7 hours and 51 minutes.  Residual AHI was 2.5 on a set pressure of 9 cm of water and EPR of 2.  There was no significant leak noted. >>  08/07/2020: I reviewed his CPAP compliance data from 07/07/2020 through 08/05/2020, which is a total of 30 days, during which time he used his machine every  night except for 1, percent used days greater than 4 hours at 90%, indicating excellent compliance, average usage of 7 hours and 14 minutes, residual AHI at goal at 0.5/h, leak on the low side with a 95th percentile at 5.5 L/min on a pressure of 9 cm with EPR of 2.  He reports that he ran out of Sunosi  some 4 days. It helps, and using CPAP also helps, he got new supplies recently.  He has not had any changes to his other medications but was treated for H. pylori recently.  He was hospitalized for dehydration and acute kidney injury recently as well.    I saw him on 10/30/19, At which time he was compliant with his CPAP.  He had  run out of his Sunosi  for about 2 weeks.  His blood pressure numbers were stable.  He wanted to increase the Sunosi  150 mg.  He was willing to monitor his blood pressure.  He did have an increase in his sertraline  and was also on BuSpar .  He reported a recent divorce in October 2020.   He had a video visit with Greig Forbes, nurse practitioner on 02/01/2020, at which time he was compliant with his CPAP and also using Sunosi  150 mg daily.     I saw him on 02/08/2019, at which time he reported elevated blood pressure values in the 150s over 90s.  He had started amlodipine  recently in January 2020.  He also was started on an antidepressant, sertraline  as well as BuSpar .  He was followed by mental health and was seeing a counselor for his anxiety and depression which had flared up as well.  He was taking Sunosi  75 mg daily.  He was advised to monitor his blood pressure values and if they started to improve and were stable, we could consider increasing it for his daytime somnolence.  He was reminded to be fully compliant with his CPAP.     I reviewed his CPAP compliance data from 09/25/2019 through 10/24/2019 which is a total of 30 days, during which time he used his CPAP 29 days with percent use days greater than 4 hours at 97%, indicating excellent compliance with an average usage of 7 hours  and 50 minutes, residual AHI at goal at 1.1/h, leak low, with a 95th percentile at 0.1 L/min on a pressure of 9 cm with EPR of 2.        I saw him on 11/09/2018, at which time he was compliant with his CPAP. He had significant residual daytime somnolence. Talked about his sleep study results including his baseline sleep study from 07/19/2018 as well as his CPAP titration study from 08/17/2018. His Epworth sleepiness score was still elevated at 15, down from 19 out of 24 before treatment. He was advised to start a trial of sunosi , and I suggested we proceed with an HLA test for narcolepsy. 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   I reviewed his CPAP compliance data from 01/07/2019 through 02/05/2019 which is a total of 30 days, during which time he used his machine 22 days with percent used days greater than 4 hours at 70% indicating adequate compliance but reduced compliance compared to 3 months ago. Average usage of 7 hours and 20 minutes for days on treatment. Residual AHI at goal at 0.9 per hour, leak on the very low side for the 95th percentile at 0.5 L/m on a pressure of 9 cm with EPR of 2.     I first met him on 07/04/2018 at the request of his dentist, at which time he reported snoring and excessive daytime somnolence, with an Epworth sleepiness score of 19 at the time. He was advised to proceed with a sleep study. He had a baseline sleep study, followed by a CPAP titration study. I went over his test results with him in detail today. Baseline sleep study from 07/19/2018 showed a sleep efficiency of 85.7%, sleep latency delayed at 44 minutes, REM latency 68.5 minutes. He had an increased percentage of slow-wave sleep and an increased percentage of REM sleep. He had a total AHI of 19.8 per hour, REM AHI was 53.6 per hour, supine AHI was 21.7  per hour, average oxygen saturation was 98%, nadir was 78%. He had no significant PLMS. He was advised to  proceed with a CPAP titration study to optimize treatment settings and monitor oxygen level properly. He had a CPAP titration on 08/17/2018. Sleep efficiency was 84.7%, sleep latency 61.5 minutes, REM latency 65.5 minutes. He had an increased percentage of REM sleep again noted. He was fitted with a small full face mask due to mouth venting noted in CPAP was initiated at 5 cm and further titrated to 9 cm as the final pressure. On the setting his AHI was 0 per hour with supine REM sleep achieved an O2 nadir of 94%. He had no significant PLMS during this study and based on his test results I prescribed CPAP therapy for home use at a pressure of 9 cm.   I reviewed his CPAP compliance data from 10/09/2018 through 11/07/2018, during which time he used his machine every night with percent used days greater than 4 hours at 87%, indicating very good compliance with an average usage of 6 hours and 47 minutes, residual AHI at goal at 1.3 per hour, leak on the low side with the 95th percentile at 3.4 L/m on a pressure of 9 cm with EPR of 3.    07/04/2018: (He) reports snoring and excessive daytime somnolence. I reviewed your office records, which you kindly included. His wife has reported that he has pauses in his breathing and snores loudly. He has also woken up with a sense of gasping for air. He has frequent morning headaches and uses BC powder for his headaches. He denies night to night nocturia except for when he takes his blood pressure medicine late at night. He is supposed to take 2 pills together daily but sometimes does not take it at all. He has gained weight. Bedtime is around 8 PM and rise time around 6 AM. He has to drive from [facility] to [place] for his work related commute and gets sleepy at the wheel, admits to having dosed off at the wheel.  His Epworth sleepiness score is 19 out of 24 today, fatigue score is 57 out of 63. He is married and lives with his wife and 2 children. He works for [employer]. He is  a nonsmoker and does not utilize alcohol on a regular basis, does drink caffeine in the form of regular soda, about 4-5 cans per day on average. His mother was diagnosed with obstructive sleep apnea and had a CPAP machine, then had weight loss surgery. He reports that his father had alcoholism.  He wakes up typically with his mouth severely dry, he suspects that he is a mouth breather. He also complains of postnasal drip, typically year long.   His Past Medical History Is Significant For: Past Medical History:  Diagnosis Date   Hypertension     His Past Surgical History Is Significant For: No past surgical history on file.  His Family History Is Significant For: Family History  Problem Relation Age of Onset   Cancer Mother 22       colon   Alcohol abuse Father    Diabetes Father    Drug abuse Brother    Stroke Brother        from vegetation due to iv drug use   Cancer Maternal Grandmother        lymphoma   Cancer Paternal Grandmother        breast   Stroke Paternal Grandmother    Early death  Neg Hx    Hyperlipidemia Neg Hx    Heart disease Neg Hx     His Social History Is Significant For: Social History   Socioeconomic History   Marital status: Married    Spouse name: Not on file   Number of children: Not on file   Years of education: Not on file   Highest education level: Not on file  Occupational History    Comment: [employer]  Tobacco Use   Smoking status: Never   Smokeless tobacco: Never  Vaping Use   Vaping status: Never Used  Substance and Sexual Activity   Alcohol use: No   Drug use: No   Sexual activity: Not on file  Other Topics Concern   Not on file  Social History Narrative   Right Handed   5-6 Sodas per day   Social Drivers of Health   Financial Resource Strain: Low Risk  (09/26/2020)   Received from [facility]   Overall Financial Resource Strain (CARDIA)  Food Insecurity: No Food Insecurity (12/27/2023)   Received [facility]   Hunger Vital Sign    Worried About Running Out of Food in the Last Year: Never true    Ran Out of Food in the Last Year: Never true  Transportation Needs: No Transportation Needs (09/26/2020)   Received from [facility]   PRAPARE - Transportation  Physical Activity: Inactive (09/26/2020)   [facility]   Exercise Vital Sign  Stress: Stress Concern Present (09/26/2020)   Received from [facility] - Occupational Stress Questionnaire  Social Connections: Moderately Isolated (09/26/2020)   Received from [facility]   Social Connection and Isolation Panel [NHANES]    His Allergies Are:  No Known Allergies:   His Current Medications Are:  Outpatient Encounter Medications as of 01/04/2024  Medication Sig   amLODipine  (NORVASC ) 10 MG tablet Take 10 mg by mouth daily.   APRETUDE 600 MG/3ML injection Inject into the muscle every 2 (two) months.   Armodafinil  250 MG tablet Take 1 tablet (250 mg total) by mouth daily.   FLUoxetine (PROZAC) 40 MG capsule Take 40 mg by mouth every morning.   losartan -hydrochlorothiazide (HYZAAR) 100-25 MG tablet Take 1 tablet by mouth daily.   No facility-administered encounter medications on file as of 01/04/2024.  :  Review of Systems:  Out of a complete 14 point review of systems, all are reviewed and negative with the exception of these symptoms as listed below:  Review of Systems  Neurological:        Patient in room #5 and alone. Patient states he been doing well with his sleeping but lately his been having  a lot of headaches.    Objective:  Neurological Exam  Physical Exam Physical Examination:   Vitals:   01/04/24 1054  BP: (!) 147/88  Pulse: 82    General Examination: The patient is a very pleasant 38 y.o. male in no acute distress. He appears well-developed and well-nourished and well  groomed.   HEENT: Normocephalic, atraumatic, pupils are equal, round and reactive to light. Extraocular tracking is good without limitation to gaze excursion or nystagmus noted. Normal smooth pursuit is noted. Hearing is grossly intact. Face is symmetric with normal facial animation and normal facial sensation. Speech is clear with no dysarthria noted. There is no hypophonia. There is no lip, neck/head, jaw or voice tremor.  Neck shows FROM. Oropharynx exam reveals: moderate mouth dryness, adequate dental hygiene and moderate airway crowding. Tongue protrudes centrally and palate elevates symmetrically.    Chest: Clear to auscultation without wheezing, rhonchi or crackles noted.   Heart: S1+S2+0, regular and normal without murmurs, rubs or gallops noted.    Abdomen: Soft, non-distended.   Extremities: There is no obvious swelling in the distal lower extremities bilaterally.    Skin: Warm and dry without trophic changes noted.   Musculoskeletal: exam reveals no obvious joint deformities or joint swelling.    Neurologically:  Mental status: The patient is awake, alert and oriented in all 4 spheres. His immediate and remote memory, attention, language skills and fund of knowledge are appropriate. There is no evidence of aphasia, agnosia, apraxia or anomia. Speech is clear with normal prosody and enunciation. Thought process is linear. Mood is normal and affect is normal.  Cranial nerves II - XII are as described above under HEENT exam. Motor exam: Normal bulk, strength and tone is noted. There is no tremor. Fine motor skills and coordination: grossly intact.  Cerebellar testing: No dysmetria or intention tremor. There is no truncal or gait ataxia.  Sensory exam: intact to light touch.  Gait, station and balance: He stands easily. No veering to one side is noted. No leaning to one side is noted. Posture is age-appropriate and stance is narrow based. Gait shows normal stride length and normal pace.  No problems turning are noted. Tandem walk is unremarkable.    Assessment and Plan:  In summary, ETHAN CLAYBURN is a very pleasant 38 year old male with an underlying medical history of hypertension and obesity, who presents for follow-up consultation of his obstructive sleep apnea and hypersomnolence disorder. His baseline sleep study from August 2019 showed moderate to severe obstructive sleep apnea and he had good results during his titration study from September 2019. He was on CPAP of 9 cm, we had plans to repeat sleep testing and a nap study in November 2024 but since he was titrated on CPAP from 9 cm to 14 cm we canceled the nap study as he was titrated further on CPAP.  I explained this to the patient, and while he understands the protocol, he is still frustrated that he did not end up having the MSLT.  He is currently on armodafinil  250 mg once daily.  Previously he tried Sunosi  but it did not really help as much.  We will switch him to Provigil  generic at this time, 200 mg twice daily.  We will stop the armodafinil  when he can feel the Provigil .  Next consideration is adding a stimulant such as Adderall XR 20 mg once daily.  We will also consider repeat sleep testing later this year if need be.  He would have to taper his Prozac again and stop his wake promoting agent at the time but for now he is advised to continue with CPAP of 14.  He is fully compliant with treatment.  He is agreeable to our plan.  His most recent sleep study from November 2024 with CPAP showed a mildly reduced REM latency and normal REM percentage.  Of note, we also checked his blood work for HLA markers for narcolepsy in the past and he had 1 positive marker.  Prior sleep studies showed increased REM percentage.  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  He is advised to follow-up routinely in this clinic in 3 months, sooner if needed.  I answered  all his questions today and he was in agreement with our plan.    I spent 40 minutes in total face-to-face time and in reviewing records during pre-charting, more than 50% of which was spent in counseling and coordination of care, reviewing test results, reviewing medications and treatment regimen and/or in discussing or reviewing the diagnosis of OSA, hypersomnolence disorder, the prognosis and treatment options. Pertinent laboratory and imaging test results that were available during this visit with the patient were reviewed by me and considered in my medical decision making (see chart for details).

## 2024-01-04 NOTE — Patient Instructions (Signed)
 We will stop the armodafinil .  We will start you on generic Provigil  200 mg: 1 pill up to 2 times a day. Avoid after 3 PM. Side effects include (but are not limited to): high blood pressure, headache, nervousness, palpitations, GI upset, tremor.   We may consider a stimulant next such as Adderall.  We may consider bringing you back for yet another sleep study and an actual nap study in the future if need be.  Please continue your CPAP with full compliance as you are.

## 2024-03-22 ENCOUNTER — Telehealth: Payer: Self-pay | Admitting: *Deleted

## 2024-03-22 DIAGNOSIS — G4719 Other hypersomnia: Secondary | ICD-10-CM

## 2024-03-22 DIAGNOSIS — G4733 Obstructive sleep apnea (adult) (pediatric): Secondary | ICD-10-CM

## 2024-03-22 DIAGNOSIS — G4710 Hypersomnia, unspecified: Secondary | ICD-10-CM

## 2024-03-22 NOTE — Telephone Encounter (Signed)
 Per Dr Omar Bibber, ok to order new machine and supplies. Order placed and sent to [facility].

## 2024-03-22 NOTE — Addendum Note (Signed)
 Addended by: Burns Carwin on: 03/22/2024 02:44 PM   Modules accepted: Orders

## 2024-03-22 NOTE — Telephone Encounter (Signed)
 Received a fax from DME stating patient is interested in a replacement pap device. I do see his start date was 08/31/18. DME is asking for a new order with numerical prescription settings and "heated humidifier" and "all supplies".

## 2024-03-24 NOTE — Telephone Encounter (Signed)
 Sean Wolf, [name], [name], RN; Alain Honey; Jeris Penta, Sean Wolf [place]; 1 other Received, thank you!

## 2024-03-29 ENCOUNTER — Encounter: Payer: Self-pay | Admitting: Neurology

## 2024-03-30 MED ORDER — AMPHETAMINE-DEXTROAMPHET ER 20 MG PO CP24
20.0000 mg | ORAL_CAPSULE | Freq: Every day | ORAL | 0 refills | Status: DC
Start: 2024-03-30 — End: 2024-04-26

## 2024-03-30 NOTE — Telephone Encounter (Signed)
 I will send a prescription for Adderall XR 20 mg once daily in the morning.

## 2024-04-10 ENCOUNTER — Telehealth: Payer: 59 | Admitting: Neurology

## 2024-04-26 MED ORDER — AMPHETAMINE-DEXTROAMPHET ER 20 MG PO CP24: 20.0000 mg | ORAL_CAPSULE | Freq: Every day | ORAL | 0 refills | Status: DC

## 2024-04-26 NOTE — Addendum Note (Signed)
 Addended by: Viktoria Gray on: 04/26/2024 09:44 AM   Modules accepted: Orders

## 2024-04-26 NOTE — Telephone Encounter (Signed)
 [facility] called [facility] [address].  (Did not get prescription). I relayed good as wanted to [facility] [address].

## 2024-04-26 NOTE — Telephone Encounter (Signed)
 Last filled 03-30-2024 #30, last seen 01-04-2024, next appt 05-31-19.

## 2024-04-26 NOTE — Addendum Note (Signed)
 Addended by: Viktoria Gray on: 04/26/2024 09:40 AM   Modules accepted: Orders

## 2024-05-25 ENCOUNTER — Other Ambulatory Visit: Payer: Self-pay | Admitting: Neurology

## 2024-05-25 DIAGNOSIS — G4710 Hypersomnia, unspecified: Secondary | ICD-10-CM

## 2024-05-25 DIAGNOSIS — G4733 Obstructive sleep apnea (adult) (pediatric): Secondary | ICD-10-CM

## 2024-05-25 MED ORDER — AMPHETAMINE-DEXTROAMPHET ER 20 MG PO CP24: 20.0000 mg | ORAL_CAPSULE | Freq: Every day | ORAL | 0 refills | Status: DC

## 2024-05-25 NOTE — Telephone Encounter (Signed)
 Requested Prescriptions   Pending Prescriptions Disp Refills   modafinil  (PROVIGIL ) 200 MG tablet [Pharmacy Med Name: MODAFINIL  200 MG TABLET] 60 tablet 3    Sig: TAKE 1 TABLET 2 TIMES DAILY TAKE 1 TAB AT 7:30 AM, SECOND DOSE AROUND NOON, NO LATER THAN 3 PM.   Last seen 01/04/24 Next appt 05/30/24  Dispenses   Dispensed Days Supply Quantity Provider Pharmacy  MODAFINIL  200 MG TABLET 04/23/2024 30 60 each Athar, Saima, MD [phone] - M...  MODAFINIL  200 MG TABLET 03/19/2024 30 60 each Athar, Saima, MD [facility] [phone] - M...  MODAFINIL  200 MG TABLET 02/14/2024 30 60 each Athar, Saima, MD [facility] [phone] - M...  MODAFINIL  200 MG TABLET 01/30/2024 15 30 each Athar, Saima, MD [facility] [phone] - M...  MODAFINIL  200 MG TABLET 01/04/2024 15 30 each Athar, Saima, MD [facility] [phone] - M.SABRASABRA

## 2024-05-25 NOTE — Telephone Encounter (Signed)
 I see Modafinil  was already sent today.  For Adderall: Last visit 01/04/24 Next visit 05/30/24 Last fills:  Rx request sent to Dr Buck

## 2024-05-25 NOTE — Addendum Note (Signed)
 Addended by: HILLIARD HEATHER CROME on: 05/25/2024 05:39 PM   Modules accepted: Orders

## 2024-05-29 ENCOUNTER — Encounter: Payer: Self-pay | Admitting: *Deleted

## 2024-05-30 ENCOUNTER — Telehealth: Payer: Self-pay | Admitting: *Deleted

## 2024-05-30 ENCOUNTER — Telehealth (INDEPENDENT_AMBULATORY_CARE_PROVIDER_SITE_OTHER): Admitting: Neurology

## 2024-05-30 ENCOUNTER — Encounter: Payer: Self-pay | Admitting: Neurology

## 2024-05-30 DIAGNOSIS — G4730 Sleep apnea, unspecified: Secondary | ICD-10-CM | POA: Diagnosis not present

## 2024-05-30 DIAGNOSIS — G4733 Obstructive sleep apnea (adult) (pediatric): Secondary | ICD-10-CM

## 2024-05-30 DIAGNOSIS — G4710 Hypersomnia, unspecified: Secondary | ICD-10-CM | POA: Diagnosis not present

## 2024-05-30 MED ORDER — MODAFINIL 200 MG PO TABS: 200.0000 mg | ORAL_TABLET | Freq: Two times a day (BID) | ORAL | 5 refills | Status: DC

## 2024-05-30 NOTE — Patient Instructions (Signed)
 Will continue with modafinil  200 mg twice daily and long-acting Adderall 20 mg once daily in the morning.  Continue to use your CPAP regularly as well as you are.  Follow-up in a year to see Greig Forbes, NP.

## 2024-05-30 NOTE — Telephone Encounter (Signed)
 Patient had a VV today. Per Dr Buck, please schedule 1 year FU with Amy L, ok to do VV.

## 2024-05-30 NOTE — Progress Notes (Signed)
 Interim history:   Mr. Gulas is a 38 year old right-handed gentleman with an underlying medical history of hypertension and obesity, who presents for follow-up consultation of his hypersomnolence with sleep apnea, well-established on CPAP therapy.  The patient is unaccompanied today.  He presents for a MyChart video visit.  Patient is located at his home and utilizes his phone for this visit, I am located at my office [facility], utilizing my work Health and safety inspector from his visit. I last saw him in February 2025, at which time I suggested we start him on Provigil  and stop the armodafinil .  We also talked about potentially starting a lower dose of Adderall XR in the interim in April 2025.  I also prescribed a new CPAP machine for him.  His set up date was 04/25/2024.  He has a ResMed air sense 11 AutoSet machine.  His DME company is [employer].  Today, 05/30/2024: He reports doing better with his medication regimen, he takes modafinil  around 7 or 8 and then again between 12 and 1 and takes the Adderall around 8 AM in the morning and it has been helpful.  He has no obvious side effects. He had to change his CPAP mask, currently using a traditional style fullface mask, he believes he has a F20. I reviewed his CPAP compliance data from 04/30/2024 through 05/29/2024, which is a total of 30 days, during which time he used his machine every night, with percent use days greater than 4 hours at 97%, indicating excellent compliance with an average usage of 7 hours and 49 minutes which is also excellent, residual AHI at goal at 0.5/h, leak on the higher side with some fluctuation, 95th percentile at 27.8 L/min, pressure of 14 cm with EPR of 1.  He is currently no longer on Sunosi  or Nuvigil .  I started him on Provigil  in February 2025.  He is on generic Provigil  200 mg twice daily and generic long-acting Adderall 20 mg once daily.  the patient's allergies, current medications, family history, past  medical history, past social history, past surgical history and problem list were reviewed and updated as appropriate.    Previously:    01/04/2024 (SA): He had a titration study on 10/17/2023, at which time he was started with a CPAP of 9 cm and titrated to a final titration pressure of 14 cm.  He did not have his next-day nap test due to titration of CPAP of more than 1 level.  He was advised to start home CPAP therapy at a pressure of 14 cm.  His REM latency was mildly below normal at 51 minutes, REM percentage normal at 22%. I reviewed his CPAP compliance data from 12/03/2023 through 08/30/2024, which is a total of 30 days, during which time he used his machine every night with percent use days greater than 4 hours at 100%, indicating superb compliance with an average usage of 8 hours and 29 minutes, residual AHI at goal at 0.6/h, pressure of 14 cm with EPR of 2.  Air leak on the higher side with the 95th percentile at 25.2 L/min.  He reports using a fullface mask for the past 2 weeks which feels a little better, his sleep has indeed improved in the past 10 to 14 days.  He is frustrated that he could not stay for the nap study to investigate his daytime somnolence and perhaps provide him with an additional diagnosis as he still is very sleepy during the day despite taking armodafinil  250 mg  once daily in the morning.  Sunosi  in the past did not help that much, he is not opposed to considering a stimulant. He would even be willing to taper off his Prozac stop his armodafinil  for repeat sleep study and nap study.  He takes the armodafinil  around 7:30 AM.  His Epworth sleepiness score is 22 out of 24 today.   06/21/2023 (Amy Lomax, NP): <<Jeramey returns for follow up for sleep apnea on CPAp and EDS. He was last seen 05/2022. MSLT ordered as he had been off antidepressants. He never scheduled testing. Since, he reports doing about the same. No significant changes. He continues armodafinil  250mg  daily. He continues  to have EDS event with taking medication and notices significant worsening if he misses a dose. He recently started a job with [employer] and is much more active but doesn't feel it has made a significant difference in sleepiness. He is fully compliant with CPAP therapy. Using therapy for 8-9 hours every night. AHI well managed. >>   06/17/2022 (Amy Lomax, NP): <<Seung returns for follow up for sleep apnea on CPAP with EDS on armodafinil  250mg  daily. He continues to do well with therapy. He is using CPAP every night for about 7-8 hours a night. He denies concerns with machine or supplies.    He continues to have difficulty with daytime sleepiness. He has new concerns of difficulty focusing. He reports that it is hard to stay alert and falls asleep if he is inactive for any period of time. He admits that weight loss efforts have been more difficulty, recently. He has trouble with low motivation. He has a history of anxiety and depression. He has not taken Prozac or Buspar  in about 4-5 months. He has an appt with psychiatry this week to discuss. He is concerned he may need to discuss narcolepsy testing with Dr Buck prior to restarting mood management medications. 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 MSLT not performed at that time as he was being treated with SSRIs. He denies cataplectic events. He is sleeping well at night. He takes armodafinil  250mg  daily but feels it is not as effective as it used to be. He does feel it works better than Sunosi . He has never tried modafinil . >>   07/28/2021 (Amy Lomax, NP): <<Jadyn B Deady is a 38 y.o. male here today for follow up for OSA on CPAP and EDS. He continues to use CPAP most every night. There have been a few nights where he falls short of meeting 4 hours but usually uses therapy for about 6 hours every night. He continues Armodafinil  250mg  daily. He does feel that it helps with EDS. He feels his best after getting up and taking medication. He  feels alert for 3-4 hours then feels he could pass out asleep at any time. He has had a hard time getting this medication form the pharmacy. Last filled 07/18/2021. Medication is on backorder. He continues to battle fatigue. He is considering consult with bariatric surgery to help with weight loss. He admits that he could work on healthier lifestyle habits. He is not exercising. >>   02/04/2021 (VV, Amy Lomax, NP): <<He returns today for follow up for OSA on CPAP and EDS on armodafinil  150mg  daily. He is doing well on CPAP therapy. Sunosi  not covered by [employer]. He has tolerated armodafinil  and usually takes around 8am. He feels that it works ok but wears off around noon. He feels that he would like to try a higher  dose. He is working from home. BP has been good at home. Usual readings are 115-120/80's. He is seeing PCP regularly.    Compliance report dated 01/04/2021 through 02/02/2021 reveals that he used CPAP 29 of the past 30 days for compliance of 97%.  He used CPAP greater than 4 hours 28 of the past 30 days for compliance of 93%.  Average usage on days used was 7 hours and 51 minutes.  Residual AHI was 2.5 on a set pressure of 9 cm of water and EPR of 2.  There was no significant leak noted. >>   08/07/2020: I reviewed his CPAP compliance data from 07/07/2020 through 08/05/2020, which is a total of 30 days, during which time he used his machine every night except for 1, percent used days greater than 4 hours at 90%, indicating excellent compliance, average usage of 7 hours and 14 minutes, residual AHI at goal at 0.5/h, leak on the low side with a 95th percentile at 5.5 L/min on a pressure of 9 cm with EPR of 2.  He reports that he ran out of Sunosi  some 4 days. It helps, and using CPAP also helps, he got new supplies recently.  He has not had any changes to his other medications but was treated for H. pylori recently.  He was hospitalized for dehydration and acute kidney injury recently as well.      I saw  him on 10/30/19, At which time he was compliant with his CPAP.  He had run out of his Sunosi  for about 2 weeks.  His blood pressure numbers were stable.  He wanted to increase the Sunosi  150 mg.  He was willing to monitor his blood pressure.  He did have an increase in his sertraline  and was also on BuSpar .  He reported a recent divorce in October 2020.   He had a video visit with Greig Forbes, nurse practitioner on 02/01/2020, at which time he was compliant with his CPAP and also using Sunosi  150 mg daily.     I saw him on 02/08/2019, at which time he reported elevated blood pressure values in the 150s over 90s.  He had started amlodipine  recently in January 2020.  He also was started on an antidepressant, sertraline  as well as BuSpar .  He was followed by mental health and was seeing a counselor for his anxiety and depression which had flared up as well.  He was taking Sunosi  75 mg daily.  He was advised to monitor his blood pressure values and if they started to improve and were stable, we could consider increasing it for his daytime somnolence.  He was reminded to be fully compliant with his CPAP.     I reviewed his CPAP compliance data from 09/25/2019 through 10/24/2019 which is a total of 30 days, during which time he used his CPAP 29 days with percent use days greater than 4 hours at 97%, indicating excellent compliance with an average usage of 7 hours and 50 minutes, residual AHI at goal at 1.1/h, leak low, with a 95th percentile at 0.1 L/min on a pressure of 9 cm with EPR of 2.        I saw him on 11/09/2018, at which time he was compliant with his CPAP. He had significant residual daytime somnolence. Talked about his sleep study results including his baseline sleep study from 07/19/2018 as well as his CPAP titration study from 08/17/2018. His Epworth sleepiness score was still elevated at 15, down from 19  out of 24 before treatment. He was advised to start a trial of sunosi , and I suggested we proceed  with an HLA test for narcolepsy. 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   I reviewed his CPAP compliance data from 01/07/2019 through 02/05/2019 which is a total of 30 days, during which time he used his machine 22 days with percent used days greater than 4 hours at 70% indicating adequate compliance but reduced compliance compared to 3 months ago. Average usage of 7 hours and 20 minutes for days on treatment. Residual AHI at goal at 0.9 per hour, leak on the very low side for the 95th percentile at 0.5 L/m on a pressure of 9 cm with EPR of 2.     I first met him on 07/04/2018 at the request of his dentist, at which time he reported snoring and excessive daytime somnolence, with an Epworth sleepiness score of 19 at the time. He was advised to proceed with a sleep study. He had a baseline sleep study, followed by a CPAP titration study. I went over his test results with him in detail today. Baseline sleep study from 07/19/2018 showed a sleep efficiency of 85.7%, sleep latency delayed at 44 minutes, REM latency 68.5 minutes. He had an increased percentage of slow-wave sleep and an increased percentage of REM sleep. He had a total AHI of 19.8 per hour, REM AHI was 53.6 per hour, supine AHI was 21.7 per hour, average oxygen saturation was 98%, nadir was 78%. He had no significant PLMS. He was advised to proceed with a CPAP titration study to optimize treatment settings and monitor oxygen level properly. He had a CPAP titration on 08/17/2018. Sleep efficiency was 84.7%, sleep latency 61.5 minutes, REM latency 65.5 minutes. He had an increased percentage of REM sleep again noted. He was fitted with a small full face mask due to mouth venting noted in CPAP was initiated at 5 cm and further titrated to 9 cm as the final pressure. On the setting his AHI was 0 per hour with supine REM sleep achieved an O2 nadir of 94%. He had no significant PLMS during this study and  based on his test results I prescribed CPAP therapy for home use at a pressure of 9 cm.   I reviewed his CPAP compliance data from 10/09/2018 through 11/07/2018, during which time he used his machine every night with percent used days greater than 4 hours at 87%, indicating very good compliance with an average usage of 6 hours and 47 minutes, residual AHI at goal at 1.3 per hour, leak on the low side with the 95th percentile at 3.4 L/m on a pressure of 9 cm with EPR of 3.    07/04/2018: (He) reports snoring and excessive daytime somnolence. I reviewed your office records, which you kindly included. His wife has reported that he has pauses in his breathing and snores loudly. He has also woken up with a sense of gasping for air. He has frequent morning headaches and uses BC powder for his headaches. He denies night to night nocturia except for when he takes his blood pressure medicine late at night. He is supposed to take 2 pills together daily but sometimes does not take it at all. He has gained weight. Bedtime is around 8 PM and rise time around 6 AM. He has to drive from [facility] to [place] for his work related commute and gets sleepy at  the wheel, admits to having dosed off at the wheel.  His Epworth sleepiness score is 19 out of 24 today, fatigue score is 57 out of 63. He is married and lives with his wife and 2 children. He works for [employer]. He is a nonsmoker and does not utilize alcohol on a regular basis, does drink caffeine in the form of regular soda, about 4-5 cans per day on average. His mother was diagnosed with obstructive sleep apnea and had a CPAP machine, then had weight loss surgery. He reports that his father had alcoholism.  He wakes up typically with his mouth severely dry, he suspects that he is a mouth breather. He also complains of postnasal drip, typically year long.   His Past Medical History Is Significant For: Past Medical History:  Diagnosis Date   Hypertension     His  Past Surgical History Is Significant For: No past surgical history on file.  His Family History Is Significant For: Family History  Problem Relation Age of Onset   Cancer Mother 72       colon   Alcohol abuse Father    Diabetes Father    Drug abuse Brother    Stroke Brother        from vegetation due to iv drug use   Cancer Maternal Grandmother        lymphoma   Cancer Paternal Grandmother        breast   Stroke Paternal Grandmother    Early death Neg Hx    Hyperlipidemia Neg Hx    Heart disease Neg Hx     His Social History Is Significant For: Social History   Socioeconomic History   Marital status: Married    Spouse name: Not on file   Number of children: Not on file   Years of education: Not on file   Highest education level: Not on file  Occupational History    Comment: [employer]  Tobacco Use   Smoking status: Never   Smokeless tobacco: Never  Vaping Use   Vaping status: Never Used  Substance and Sexual Activity   Alcohol use: No   Drug use: No   Sexual activity: Not on file  Other Topics Concern   Not on file  Social History Narrative   Right Handed   5-6 Sodas per day   Social Drivers of Health   Financial Resource Strain: Medium Risk (03/20/2024)   Received from [facility]   Overall Financial Resource Strain (CARDIA)    Difficulty of Paying Living Expenses: Somewhat hard  Food Insecurity: No Food Insecurity (03/20/2024)   [facility]   Hunger Vital Sign    Within the past 12 months, you worried that your food would run out before you got the money to buy more.: Never true    Within the past 12 months, the food you bought just didn't last and you didn't have money to get more.: Never true  Transportation Needs: No Transportation Needs (03/20/2024)   Received from [facility] - Transportation    In the past 12 months, has lack of transportation kept you from medical  appointments or from getting medications?: No    Lack of Transportation (Non-Medical): No  Physical Activity: Inactive (09/26/2020)   Received from [facility]   Exercise Vital Sign  Stress: Stress Concern Present (09/26/2020)   Received from [facility]   [facility]  of Occupational Health - Occupational Stress Questionnaire  Social Connections: Moderately Isolated (09/26/2020)   Received from [facility]   Social Connection and Isolation Panel    His Allergies Are:  No Known Allergies:   His Current Medications Are:  Outpatient Encounter Medications as of 05/30/2024  Medication Sig   amLODipine  (NORVASC ) 10 MG tablet Take 10 mg by mouth daily.   amphetamine -dextroamphetamine (ADDERALL XR) 20 MG 24 hr capsule Take 1 capsule (20 mg total) by mouth daily.   APRETUDE 600 MG/3ML injection Inject into the muscle every 2 (two) months.   FLUoxetine (PROZAC) 40 MG capsule Take 40 mg by mouth every morning.   losartan -hydrochlorothiazide (HYZAAR) 100-25 MG tablet Take 1 tablet by mouth daily.   modafinil  (PROVIGIL ) 200 MG tablet TAKE 1 TABLET 2 TIMES DAILY TAKE 1 TAB AT 7:30 AM, SECOND DOSE AROUND NOON, NO LATER THAN 3 PM.   No facility-administered encounter medications on file as of 05/30/2024.  :  Review of Systems:  Out of a complete 14 point review of systems, all are reviewed and negative with the exception of these symptoms as listed below:  Virtual Visit via Video Note on @ TODAY@  I connected withNAME@ on 05/30/24 at  9:15 AM EDT by a video enabled telemedicine application and verified that I am speaking with the correct person using two identifiers.   I discussed the limitations of evaluation and management by telemedicine and the availability of in person appointments. The patient expressed understanding and agreed to proceed.  History of Present Illness: See above.   Observations/Objective: Pleasant, conversant, no  acute distress.  Good comprehension.  Able to speak in full sentences, normal facial animation, speech without voice tremor, hypophonia or dysarthria.  Upper body movements unremarkable, no obvious upper body tremor.  Assessment and Plan: In summary, LEIGH KAEDING is a very pleasant 38 year old male with an underlying medical history of hypertension and obesity, who presents for follow-up consultation of his obstructive sleep apnea and hypersomnolence disorder. His baseline sleep study from August 2019 showed moderate to severe obstructive sleep apnea and he had good results during his titration study from September 2019. He was on CPAP of 9 cm.  We had planned to repeat sleep testing and schedule a next day nap study in November 2024 but since he was titrated on CPAP from 9 cm to 14 cm we canceled the nap study as he was titrated further on CPAP.   He received a new CPAP machine on 04/25/2024.  He continues to be compliant with CPAP and is commended for it. He was on armodafinil  250 mg once daily.   Previously he tried Sunosi  but it did not really help as much.  We switched him from armodafinil  to modafinil  in February 2025.   We added long-acting Adderall 20 mg once daily in April 2025.  He is doing quite well with this medication regimen.   We may consider a repeat sleep study in the future if the need arises.   For now, he is advised to continue with CPAP of 14 centimeters and continue with modafinil  200 mg twice daily, Adderall long-acting 20 mg once daily. He is advised to follow-up in a year to see Greig Forbes, NP, we can offer a virtual visit at the time if he prefers.  I answered all his questions today and he was in agreement.  Of note, we also checked his blood work for HLA markers for narcolepsy in the past and  he had 1 positive marker.  Prior sleep studies showed increased REM percentage.    Follow Up Instructions:    I discussed the assessment and treatment plan with the patient. The  patient was provided an opportunity to ask questions and all were answered. The patient agreed with the plan and demonstrated an understanding of the instructions.   The patient was advised to call back or seek an in-person evaluation if the symptoms worsen or if the condition fails to improve as anticipated.  I provided 30 minutes of non-face-to-face time during this encounter.   True Mar, MD

## 2024-06-05 NOTE — Telephone Encounter (Signed)
 Noted thanks

## 2024-06-05 NOTE — Telephone Encounter (Signed)
 Called and got pt scheduled for yr f/u.

## 2024-06-21 MED ORDER — AMPHETAMINE-DEXTROAMPHET ER 20 MG PO CP24: 20.0000 mg | ORAL_CAPSULE | Freq: Every day | ORAL | 0 refills | Status: DC

## 2024-06-21 NOTE — Addendum Note (Signed)
 Addended by: SHONA SAVANT A on: 06/21/2024 10:48 AM   Modules accepted: Orders

## 2024-07-31 ENCOUNTER — Encounter: Payer: Self-pay | Admitting: Neurology

## 2024-08-01 ENCOUNTER — Other Ambulatory Visit: Payer: Self-pay | Admitting: *Deleted

## 2024-08-01 NOTE — Telephone Encounter (Signed)
See refill encounter for refill.

## 2024-08-01 NOTE — Telephone Encounter (Signed)
 Last visit: 05/30/24  Next visit: 05/31/25  Per [place] registry, last fill:

## 2024-08-02 MED ORDER — AMPHETAMINE-DEXTROAMPHET ER 20 MG PO CP24: 20.0000 mg | ORAL_CAPSULE | Freq: Every day | ORAL | 0 refills | Status: DC

## 2024-08-31 ENCOUNTER — Other Ambulatory Visit: Payer: Self-pay | Admitting: *Deleted

## 2024-08-31 MED ORDER — AMPHETAMINE-DEXTROAMPHET ER 20 MG PO CP24: 20.0000 mg | ORAL_CAPSULE | Freq: Every day | ORAL | 0 refills | Status: DC

## 2024-08-31 NOTE — Telephone Encounter (Signed)
 Last fill 08-02-2024 #30.  Last seen 05-30-2024, next appt 06-04-2025.

## 2024-10-09 ENCOUNTER — Encounter: Payer: Self-pay | Admitting: Neurology

## 2024-10-09 MED ORDER — AMPHETAMINE-DEXTROAMPHET ER 20 MG PO CP24: 20.0000 mg | ORAL_CAPSULE | Freq: Every day | ORAL | 0 refills | Status: DC

## 2024-10-09 NOTE — Telephone Encounter (Signed)
 Last seen 05-30-2024, next appt 06-04-2025, last filled 09-04-2024 #30.

## 2024-10-16 ENCOUNTER — Telehealth: Payer: Self-pay

## 2024-10-16 ENCOUNTER — Other Ambulatory Visit (HOSPITAL_COMMUNITY): Payer: Self-pay

## 2024-10-16 ENCOUNTER — Encounter: Payer: Self-pay | Admitting: Neurology

## 2024-10-16 NOTE — Telephone Encounter (Signed)
 Noted thanks

## 2024-10-16 NOTE — Telephone Encounter (Signed)
 Pharmacy Patient Advocate Encounter  Received notification from [facility] that Prior Authorization for Modafinil  200mg  Tablet has been APPROVED from 10/16/2024 to 10/16/2025. Ran test claim, Copay is $15.00. This test claim was processed through [facility]- copay amounts may vary at other pharmacies due to pharmacy/plan contracts, or as the patient moves through the different stages of their insurance plan.   PA #/Case ID/Reference #: [id number]   [id number]

## 2024-11-14 ENCOUNTER — Encounter: Payer: Self-pay | Admitting: Neurology

## 2024-11-14 MED ORDER — AMPHETAMINE-DEXTROAMPHET ER 20 MG PO CP24: 20.0000 mg | ORAL_CAPSULE | Freq: Every day | ORAL | 0 refills | Status: DC

## 2024-12-15 ENCOUNTER — Encounter: Payer: Self-pay | Admitting: Neurology

## 2024-12-15 MED ORDER — AMPHETAMINE-DEXTROAMPHET ER 20 MG PO CP24: 20.0000 mg | ORAL_CAPSULE | Freq: Every day | ORAL | 0 refills | Status: AC

## 2024-12-15 NOTE — Telephone Encounter (Signed)
 Last seen 05-30-2024, next appt 06-04-2025. Last fill 11-14-2024 # 30

## 2024-12-19 ENCOUNTER — Other Ambulatory Visit: Payer: Self-pay | Admitting: Neurology

## 2024-12-19 DIAGNOSIS — G4733 Obstructive sleep apnea (adult) (pediatric): Secondary | ICD-10-CM

## 2024-12-19 DIAGNOSIS — G4710 Hypersomnia, unspecified: Secondary | ICD-10-CM

## 2025-05-31 ENCOUNTER — Telehealth: Admitting: Family Medicine

## 2025-06-04 ENCOUNTER — Telehealth: Admitting: Family Medicine
